# Patient Record
Sex: Male | Born: 1960 | Race: White | Hispanic: No | Marital: Single
Health system: Northeastern US, Academic
[De-identification: ages and names within clinical notes are randomized; demographics above are authoritative.]

## PROBLEM LIST (undated history)

## (undated) DIAGNOSIS — E785 Hyperlipidemia, unspecified: Secondary | ICD-10-CM

## (undated) HISTORY — DX: Hyperlipidemia, unspecified: E78.5

---

## 2000-03-13 HISTORY — PX: HERNIA REPAIR: SHX51

## 2013-05-01 ENCOUNTER — Ambulatory Visit (INDEPENDENT_AMBULATORY_CARE_PROVIDER_SITE_OTHER): Payer: BC Managed Care – PPO | Admitting: Internal Medicine

## 2013-05-01 ENCOUNTER — Encounter: Payer: Self-pay | Admitting: Internal Medicine

## 2013-05-01 VITALS — BP 124/78 | HR 93 | Temp 98.7°F | Ht 68.75 in | Wt 200.5 lb

## 2013-05-01 DIAGNOSIS — L821 Other seborrheic keratosis: Secondary | ICD-10-CM | POA: Insufficient documentation

## 2013-05-01 DIAGNOSIS — Z23 Encounter for immunization: Secondary | ICD-10-CM

## 2013-05-01 DIAGNOSIS — Z0000 Encounter for general adult medical examination without abnormal findings: Secondary | ICD-10-CM

## 2013-05-01 NOTE — Assessment & Plan Note (Signed)
If it bother you, we can biopsy the one on the right lower back  Call and let me know when you want this done

## 2013-05-01 NOTE — Progress Notes (Signed)
HPI  Pt presents to the clinic today to establish care. Richard [name] does not have a PCP. Richard [name] does have some concerns about a mole on his back. It has not changed in the last 10 years. Richard [name] did just want to have it looked at.  Flu: never Tetanus: more than 10 years ago PSA screening: never Colon Screening: never Eye Doctor: yearly Dentist:  As needed  History reviewed. No pertinent past medical history.  Current Outpatient Prescriptions  Medication Sig Dispense Refill  . Multiple Vitamin (MULTIVITAMIN) tablet Take 1 tablet by mouth daily.       No current facility-administered medications for this visit.    No Known Allergies  Family History  Problem Relation Age of Onset  . Cancer Mother     liver  . Cancer Maternal Uncle     bone  . Diabetes Paternal Grandmother   . Early death Neg Hx   . Hypertension Neg Hx   . Stroke Neg Hx     History   Social History  . Marital Status: Married    Spouse Name: N/A    Number of Children: N/A  . Years of Education: N/A   Occupational History  . Not on file.   Social History Main Topics  . Smoking status: Current Every Day Smoker -- 1.00 packs/day for 35 years    Types: Cigarettes  . Smokeless tobacco: Never Used  . Alcohol Use: No  . Drug Use: No  . Sexual Activity: Not on file   Other Topics Concern  . Not on file   Social History Narrative  . No narrative on file    ROS:  Constitutional: Denies fever, malaise, fatigue, headache or abrupt weight changes.  HEENT: Denies eye pain, eye redness, ear pain, ringing in the ears, wax buildup, runny nose, nasal congestion, bloody nose, or sore throat. Respiratory: Denies difficulty breathing, shortness of breath, cough or sputum production.   Cardiovascular: Denies chest pain, chest tightness, palpitations or swelling in the hands or feet.  Gastrointestinal: Denies abdominal pain, bloating, constipation, diarrhea or blood in the stool.  GU: Denies frequency, urgency, pain with  urination, blood in urine, odor or discharge. Musculoskeletal: Pt reports bursitis in bilateral shoulders. Denies decrease in range of motion, difficulty with gait, muscle pain or joint pain and swelling.  Skin: Pt reports moles on back. Denies redness, rashes, lesions or ulcercations.  Neurological: Denies dizziness, difficulty with memory, difficulty with speech or problems with balance and coordination.   No other specific complaints in a complete review of systems (except as listed in HPI above).  PE:  BP 124/78  Pulse 93  Temp(Src) 98.7 F (37.1 C) (Oral)  Ht 5' 8.75" (1.746 m)  Wt 200 lb 8 oz (90.946 kg)  BMI 29.83 kg/m2  SpO2 97% Wt Readings from Last 3 Encounters:  05/01/13 200 lb 8 oz (90.946 kg)    General: Appears his stated age, well developed, well nourished in NAD. Skin: Large 1 cm oval shaped mole note in right lower back. Skin otherwise clean, dry and intact. HEENT: Head: normal shape and size; Eyes: sclera white, no icterus, conjunctiva pink, PERRLA and EOMs intact; Ears: Tm's gray and intact, normal light reflex; Nose: mucosa pink and moist, septum midline; Throat/Mouth: Teeth present, mucosa pink and moist, no lesions or ulcerations noted.  Neck: Normal range of motion. Neck supple, trachea midline. No massses, lumps or thyromegaly present.  Cardiovascular: Normal rate and rhythm. S1,S2 noted.  No murmur, rubs or gallops noted.  No JVD or BLE edema. No carotid bruits noted. Pulmonary/Chest: Normal effort and positive vesicular breath sounds. No respiratory distress. No wheezes, rales or ronchi noted.  Abdomen: Soft and nontender. Normal bowel sounds, no bruits noted. No distention or masses noted. Liver, spleen and kidneys non palpable. Musculoskeletal: Normal range of motion. No signs of joint swelling. No difficulty with gait.  Neurological: Alert and oriented. Cranial nerves II-XII intact. Coordination normal. +DTRs bilaterally. Psychiatric: Mood and affect normal.  Behavior is normal. Judgment and thought content normal.    Assessment and Plan:  Preventative Health Maintenance:  Tdap given today Will obtain screening labs including PSA Stop smoking Advised him to work on diet and exercise Richard [name] declines colon screening at this time  RTC in 1 year or sooner if needed

## 2013-05-01 NOTE — Patient Instructions (Addendum)

## 2013-05-01 NOTE — Progress Notes (Signed)
Pre visit review using our clinic review tool, if applicable. No additional management support is needed unless otherwise documented below in the visit note. 

## 2013-05-01 NOTE — Addendum Note (Signed)
Addended by: Roena MaladyEVONTENNO, Chelisa Hennen Y on: 05/01/2013 04:42 PM   Modules accepted: Orders

## 2013-05-02 LAB — COMPREHENSIVE METABOLIC PANEL
ALBUMIN: 4.2 g/dL (ref 3.5–5.2)
ALK PHOS: 53 U/L (ref 39–117)
ALT: 23 U/L (ref 0–53)
AST: 18 U/L (ref 0–37)
BUN: 14 mg/dL (ref 6–23)
CO2: 30 mEq/L (ref 19–32)
CREATININE: 1 mg/dL (ref 0.4–1.5)
Calcium: 9.5 mg/dL (ref 8.4–10.5)
Chloride: 106 mEq/L (ref 96–112)
GFR: 82.01 mL/min (ref >60.00)
Glucose, Bld: 94 mg/dL (ref 70–99)
Potassium: 4.7 mEq/L (ref 3.5–5.1)
Sodium: 140 mEq/L (ref 135–145)
Total Bilirubin: 0.3 mg/dL (ref 0.3–1.2)
Total Protein: 7.4 g/dL (ref 6.0–8.3)

## 2013-05-02 LAB — CBC
HCT: 50.2 % (ref 39.0–52.0)
HEMOGLOBIN: 16.5 g/dL (ref 13.0–17.0)
MCHC: 32.9 g/dL (ref 30.0–36.0)
MCV: 89.4 fl (ref 78.0–100.0)
PLATELETS: 237 10*3/uL (ref 150.0–400.0)
RBC: 5.61 Mil/uL (ref 4.22–5.81)
RDW: 14.5 % (ref 11.5–14.6)
WBC: 13 10*3/uL — ABNORMAL HIGH (ref 4.5–10.5)

## 2013-05-02 LAB — PSA: PSA: 1.74 ng/mL (ref 0.10–4.00)

## 2013-05-02 LAB — LIPID PANEL
CHOL/HDL RATIO: 6
Cholesterol: 256 mg/dL — ABNORMAL HIGH (ref 0–200)
HDL: 40.2 mg/dL (ref >39.00)
TRIGLYCERIDES: 217 mg/dL — AB (ref 0.0–149.0)
VLDL: 43.4 mg/dL — ABNORMAL HIGH (ref 0.0–40.0)

## 2013-05-02 LAB — LDL CHOLESTEROL, DIRECT: Direct LDL: 188.6 mg/dL

## 2013-05-15 ENCOUNTER — Ambulatory Visit: Payer: BC Managed Care – PPO | Admitting: Internal Medicine

## 2013-07-31 ENCOUNTER — Ambulatory Visit (INDEPENDENT_AMBULATORY_CARE_PROVIDER_SITE_OTHER): Payer: BC Managed Care – PPO | Admitting: Internal Medicine

## 2013-07-31 ENCOUNTER — Encounter: Payer: Self-pay | Admitting: Internal Medicine

## 2013-07-31 VITALS — BP 104/66 | HR 77 | Temp 98.0°F | Wt 200.2 lb

## 2013-07-31 DIAGNOSIS — E785 Hyperlipidemia, unspecified: Secondary | ICD-10-CM | POA: Insufficient documentation

## 2013-07-31 NOTE — Progress Notes (Signed)
Subjective:    Patient ID: Richard [name], male    DOB: [birth year], 53 y.o.   MRN: [id number]  HPI  Pt presents to the clinic today to followup elevated cholesterol and triglycerides. In 04/2013, his total cholesterol was 256, LDL 188.6, HDL 40.2 and triglycerides of 217. He was started on a low cholesterol, low fat diet and encouraged to exercise. He has been doing well with the diet plan given. He reports he has not started the fish oil that was instructed.  Review of Systems      No past medical history on file.  Current Outpatient Prescriptions  Medication Sig Dispense Refill  . Multiple Vitamin (MULTIVITAMIN) tablet Take 1 tablet by mouth daily.       No current facility-administered medications for this visit.    No Known Allergies  Family History  Problem Relation Age of Onset  . Cancer Mother     liver  . Cancer Maternal Uncle     bone  . Diabetes Paternal Grandmother   . Early death Neg Hx   . Hypertension Neg Hx   . Stroke Neg Hx     History   Social History  . Marital Status: Married    Spouse Name: N/A    Number of Children: N/A  . Years of Education: N/A   Occupational History  . Not on file.   Social History Main Topics  . Smoking status: Current Every Day Smoker -- 1.00 packs/day for 35 years    Types: Cigarettes  . Smokeless tobacco: Never Used  . Alcohol Use: No  . Drug Use: No  . Sexual Activity: Not on file   Other Topics Concern  . Not on file   Social History Narrative  . No narrative on file     Constitutional: Denies fever, malaise, fatigue, headache or abrupt weight changes.  Respiratory: Denies difficulty breathing, shortness of breath, cough or sputum production.   Cardiovascular: Denies chest pain, chest tightness, palpitations or swelling in the hands or feet.    No other specific complaints in a complete review of systems (except as listed in HPI above).  Objective:   Physical Exam   BP 104/66  Pulse 77   Temp(Src) 98 F (36.7 C) (Oral)  Wt 200 lb 4 oz (90.833 kg)  SpO2 97% Wt Readings from Last 3 Encounters:  07/31/13 200 lb 4 oz (90.833 kg)  05/01/13 200 lb 8 oz (90.946 kg)    General: Appears his stated age, well developed, well nourished in NAD. Cardiovascular: Normal rate and rhythm. S1,S2 noted.  No murmur, rubs or gallops noted. No JVD or BLE edema. No carotid bruits noted. Pulmonary/Chest: Normal effort and positive vesicular breath sounds. No respiratory distress. No wheezes, rales or ronchi noted.   BMET    Component Value Date/Time   NA 140 05/01/2013 1602   K 4.7 05/01/2013 1602   CL 106 05/01/2013 1602   CO2 30 05/01/2013 1602   GLUCOSE 94 05/01/2013 1602   BUN 14 05/01/2013 1602   CREATININE 1.0 05/01/2013 1602   CALCIUM 9.5 05/01/2013 1602    Lipid Panel     Component Value Date/Time   CHOL 256* 05/01/2013 1602   TRIG 217.0* 05/01/2013 1602   HDL 40.20 05/01/2013 1602   CHOLHDL 6 05/01/2013 1602   VLDL 43.4* 05/01/2013 1602    CBC    Component Value Date/Time   WBC 13.0* 05/01/2013 1602   RBC 5.61 05/01/2013 1602   HGB  16.5 05/01/2013 1602   HCT 50.2 05/01/2013 1602   PLT 237.0 05/01/2013 1602   MCV 89.4 05/01/2013 1602   MCHC 32.9 05/01/2013 1602   RDW 14.5 05/01/2013 1602    Hgb A1C No results found for this basename: HGBA1C        Assessment & Plan:

## 2013-07-31 NOTE — Assessment & Plan Note (Signed)
Will recheck lipid profile today If remains elevated, will start crestor Encouraged him to continue to work on diet and exercise

## 2013-07-31 NOTE — Progress Notes (Signed)
Pre visit review using our clinic review tool, if applicable. No additional management support is needed unless otherwise documented below in the visit note. 

## 2013-07-31 NOTE — Patient Instructions (Addendum)
Fat and Cholesterol Control Diet  Fat and cholesterol levels in your blood and organs are influenced by your diet. High levels of fat and cholesterol may lead to diseases of the heart, small and large blood vessels, gallbladder, liver, and pancreas.  CONTROLLING FAT AND CHOLESTEROL WITH DIET  Although exercise and lifestyle factors are important, your diet is key. That is because certain foods are known to raise cholesterol and others to lower it. The goal is to balance foods for their effect on cholesterol and more importantly, to replace saturated and trans fat with other types of fat, such as monounsaturated fat, polyunsaturated fat, and omega-3 fatty acids.  On average, a person should consume no more than 15 to 17 g of saturated fat daily. Saturated and trans fats are considered "bad" fats, and they will raise LDL cholesterol. Saturated fats are primarily found in animal products such as meats, butter, and cream. However, that does not mean you need to give up all your favorite foods. Today, there are good tasting, low-fat, low-cholesterol substitutes for most of the things you like to eat. Choose low-fat or nonfat alternatives. Choose round or loin cuts of red meat. These types of cuts are lowest in fat and cholesterol. Chicken (without the skin), fish, veal, and ground turkey breast are great choices. Eliminate fatty meats, such as hot dogs and salami. Even shellfish have little or no saturated fat. Have a 3 oz (85 g) portion when you eat lean meat, poultry, or fish.  Trans fats are also called "partially hydrogenated oils." They are oils that have been scientifically manipulated so that they are solid at room temperature resulting in a longer shelf life and improved taste and texture of foods in which they are added. Trans fats are found in stick margarine, some tub margarines, cookies, crackers, and baked goods.   When baking and cooking, oils are a great substitute for butter. The monounsaturated oils are  especially beneficial since it is believed they lower LDL and raise HDL. The oils you should avoid entirely are saturated tropical oils, such as coconut and palm.   Remember to eat a lot from food groups that are naturally free of saturated and trans fat, including fish, fruit, vegetables, beans, grains (barley, rice, couscous, bulgur wheat), and pasta (without cream sauces).   IDENTIFYING FOODS THAT LOWER FAT AND CHOLESTEROL   Soluble fiber may lower your cholesterol. This type of fiber is found in fruits such as apples, vegetables such as broccoli, potatoes, and carrots, legumes such as beans, peas, and lentils, and grains such as barley. Foods fortified with plant sterols (phytosterol) may also lower cholesterol. You should eat at least 2 g per day of these foods for a cholesterol lowering effect.   Read package labels to identify low-saturated fats, trans fat free, and low-fat foods at the supermarket. Select cheeses that have only 2 to 3 g saturated fat per ounce. Use a heart-healthy tub margarine that is free of trans fats or partially hydrogenated oil. When buying baked goods (cookies, crackers), avoid partially hydrogenated oils. Breads and muffins should be made from whole grains (whole-wheat or whole oat flour, instead of "flour" or "enriched flour"). Buy non-creamy canned soups with reduced salt and no added fats.   FOOD PREPARATION TECHNIQUES   Never deep-fry. If you must fry, either stir-fry, which uses very little fat, or use non-stick cooking sprays. When possible, broil, bake, or roast meats, and steam vegetables. Instead of putting butter or margarine on vegetables, use lemon   and herbs, applesauce, and cinnamon (for squash and sweet potatoes). Use nonfat yogurt, salsa, and low-fat dressings for salads.   LOW-SATURATED FAT / LOW-FAT FOOD SUBSTITUTES  Meats / Saturated Fat (g)  · Avoid: Steak, marbled (3 oz/85 g) / 11 g  · Choose: Steak, lean (3 oz/85 g) / 4 g  · Avoid: Hamburger (3 oz/85 g) / 7  g  · Choose: Hamburger, lean (3 oz/85 g) / 5 g  · Avoid: Ham (3 oz/85 g) / 6 g  · Choose: Ham, lean cut (3 oz/85 g) / 2.4 g  · Avoid: Chicken, with skin, dark meat (3 oz/85 g) / 4 g  · Choose: Chicken, skin removed, dark meat (3 oz/85 g) / 2 g  · Avoid: Chicken, with skin, light meat (3 oz/85 g) / 2.5 g  · Choose: Chicken, skin removed, light meat (3 oz/85 g) / 1 g  Dairy / Saturated Fat (g)  · Avoid: Whole milk (1 cup) / 5 g  · Choose: Low-fat milk, 2% (1 cup) / 3 g  · Choose: Low-fat milk, 1% (1 cup) / 1.5 g  · Choose: Skim milk (1 cup) / 0.3 g  · Avoid: Hard cheese (1 oz/28 g) / 6 g  · Choose: Skim milk cheese (1 oz/28 g) / 2 to 3 g  · Avoid: Cottage cheese, 4% fat (1 cup) / 6.5 g  · Choose: Low-fat cottage cheese, 1% fat (1 cup) / 1.5 g  · Avoid: Ice cream (1 cup) / 9 g  · Choose: Sherbet (1 cup) / 2.5 g  · Choose: Nonfat frozen yogurt (1 cup) / 0.3 g  · Choose: Frozen fruit bar / trace  · Avoid: Whipped cream (1 tbs) / 3.5 g  · Choose: Nondairy whipped topping (1 tbs) / 1 g  Condiments / Saturated Fat (g)  · Avoid: Mayonnaise (1 tbs) / 2 g  · Choose: Low-fat mayonnaise (1 tbs) / 1 g  · Avoid: Butter (1 tbs) / 7 g  · Choose: Extra light margarine (1 tbs) / 1 g  · Avoid: Coconut oil (1 tbs) / 11.8 g  · Choose: Olive oil (1 tbs) / 1.8 g  · Choose: Corn oil (1 tbs) / 1.7 g  · Choose: Safflower oil (1 tbs) / 1.2 g  · Choose: Sunflower oil (1 tbs) / 1.4 g  · Choose: Soybean oil (1 tbs) / 2.4 g  · Choose: Canola oil (1 tbs) / 1 g  Document Released: 02/27/2005 Document Revised: 06/24/2012 Document Reviewed: 08/18/2010  [employer]® Patient Information ©2014 [employer].

## 2013-08-01 LAB — LIPID PANEL
CHOL/HDL RATIO: 6
Cholesterol: 197 mg/dL (ref 0–200)
HDL: 32.7 mg/dL — ABNORMAL LOW (ref >39.00)
LDL Cholesterol: 144 mg/dL — ABNORMAL HIGH (ref 0–99)
Triglycerides: 100 mg/dL (ref 0.0–149.0)
VLDL: 20 mg/dL (ref 0.0–40.0)

## 2014-01-07 ENCOUNTER — Encounter: Payer: Self-pay | Admitting: Internal Medicine

## 2014-01-07 ENCOUNTER — Encounter: Payer: BC Managed Care – PPO | Admitting: Internal Medicine

## 2014-01-07 NOTE — Progress Notes (Signed)
error 

## 2014-01-08 ENCOUNTER — Telehealth: Payer: Self-pay | Admitting: Internal Medicine

## 2014-01-08 NOTE — Telephone Encounter (Signed)
emmi emailed °

## 2014-05-06 ENCOUNTER — Ambulatory Visit (INDEPENDENT_AMBULATORY_CARE_PROVIDER_SITE_OTHER): Payer: BLUE CROSS/BLUE SHIELD | Admitting: Internal Medicine

## 2014-05-06 ENCOUNTER — Encounter: Payer: Self-pay | Admitting: Internal Medicine

## 2014-05-06 VITALS — BP 116/68 | HR 69 | Temp 97.9°F | Ht 69.0 in | Wt 199.5 lb

## 2014-05-06 DIAGNOSIS — Z125 Encounter for screening for malignant neoplasm of prostate: Secondary | ICD-10-CM

## 2014-05-06 DIAGNOSIS — Z0000 Encounter for general adult medical examination without abnormal findings: Secondary | ICD-10-CM

## 2014-05-06 LAB — CBC
HCT: 44.9 % (ref 39.0–52.0)
Hemoglobin: 15.4 g/dL (ref 13.0–17.0)
MCHC: 34.2 g/dL (ref 30.0–36.0)
MCV: 86.2 fl (ref 78.0–100.0)
Platelets: 213 10*3/uL (ref 150.0–400.0)
RBC: 5.21 Mil/uL (ref 4.22–5.81)
RDW: 14 % (ref 11.5–15.5)
WBC: 10.9 10*3/uL — AB (ref 4.0–10.5)

## 2014-05-06 LAB — COMPREHENSIVE METABOLIC PANEL
ALK PHOS: 53 U/L (ref 39–117)
ALT: 13 U/L (ref 0–53)
AST: 13 U/L (ref 0–37)
Albumin: 4 g/dL (ref 3.5–5.2)
BILIRUBIN TOTAL: 0.6 mg/dL (ref 0.2–1.2)
BUN: 14 mg/dL (ref 6–23)
CO2: 27 mEq/L (ref 19–32)
Calcium: 8.7 mg/dL (ref 8.4–10.5)
Chloride: 109 mEq/L (ref 96–112)
Creatinine, Ser: 0.94 mg/dL (ref 0.40–1.50)
GFR: 88.75 mL/min (ref >60.00)
GLUCOSE: 95 mg/dL (ref 70–99)
POTASSIUM: 3.9 meq/L (ref 3.5–5.1)
Sodium: 140 mEq/L (ref 135–145)
Total Protein: 6.5 g/dL (ref 6.0–8.3)

## 2014-05-06 LAB — LIPID PANEL
CHOL/HDL RATIO: 6
Cholesterol: 214 mg/dL — ABNORMAL HIGH (ref 0–200)
HDL: 35 mg/dL — ABNORMAL LOW (ref >39.00)
LDL Cholesterol: 147 mg/dL — ABNORMAL HIGH (ref 0–99)
NONHDL: 179
Triglycerides: 161 mg/dL — ABNORMAL HIGH (ref 0.0–149.0)
VLDL: 32.2 mg/dL (ref 0.0–40.0)

## 2014-05-06 LAB — PSA: PSA: 1.07 ng/mL (ref 0.10–4.00)

## 2014-05-06 NOTE — Progress Notes (Signed)
Pre visit review using our clinic review tool, if applicable. No additional management support is needed unless otherwise documented below in the visit note. 

## 2014-05-06 NOTE — Progress Notes (Signed)
Subjective:    Patient ID: Richard [name], male    DOB: 05/11/1960, 54 y.o.   MRN: [id number]  HPI  Pt presents to the clinic today for his annual exam.  Flu: never Tetanus: 2015 PSA Screening: 04/2013 Colon Screening: never Vision Screening: within the last year Dentist: as needed  Diet: He does not adhere to any specific diet Exercise: none  Review of Systems      No past medical history on file.  Current Outpatient Prescriptions  Medication Sig Dispense Refill  . Multiple Vitamin (MULTIVITAMIN) tablet Take 1 tablet by mouth daily.     No current facility-administered medications for this visit.    No Known Allergies  Family History  Problem Relation Age of Onset  . Cancer Mother     liver  . Cancer Maternal Uncle     bone  . Diabetes Paternal Grandmother   . Early death Neg Hx   . Hypertension Neg Hx   . Stroke Neg Hx     History   Social History  . Marital Status: Married    Spouse Name: N/A  . Number of Children: N/A  . Years of Education: N/A   Occupational History  . Not on file.   Social History Main Topics  . Smoking status: Current Every Day Smoker -- 1.00 packs/day for 35 years    Types: Cigarettes  . Smokeless tobacco: Never Used  . Alcohol Use: No  . Drug Use: No  . Sexual Activity: Not on file   Other Topics Concern  . Not on file   Social History Narrative     Constitutional: Denies fever, malaise, fatigue, headache or abrupt weight changes.  HEENT: Denies eye pain, eye redness, ear pain, ringing in the ears, wax buildup, runny nose, nasal congestion, bloody nose, or sore throat. Respiratory: Denies difficulty breathing, shortness of breath, cough or sputum production.   Cardiovascular: Denies chest pain, chest tightness, palpitations or swelling in the hands or feet.  Gastrointestinal: Denies abdominal pain, bloating, constipation, diarrhea or blood in the stool.  GU: Denies urgency, frequency, pain with urination, burning  sensation, blood in urine, odor or discharge. Musculoskeletal: Denies decrease in range of motion, difficulty with gait, muscle pain or joint pain and swelling.  Skin: Denies redness, rashes, lesions or ulcercations.  Neurological: Denies dizziness, difficulty with memory, difficulty with speech or problems with balance and coordination.  Psych: Pt denies depression, anxiety, SI/HI.  No other specific complaints in a complete review of systems (except as listed in HPI above).  Objective:   Physical Exam   BP 116/68 mmHg  Pulse 69  Temp(Src) 97.9 F (36.6 C) (Oral)  Ht 5\' 9"  (1.753 m)  Wt 199 lb 8 oz (90.493 kg)  BMI 29.45 kg/m2  SpO2 97% Wt Readings from Last 3 Encounters:  05/06/14 199 lb 8 oz (90.493 kg)  07/31/13 200 lb 4 oz (90.833 kg)  05/01/13 200 lb 8 oz (90.946 kg)    General: Appears hisstated age, well developed, well nourished in NAD. Skin: Warm, dry and intact. No rashes, lesions or ulcerations noted. HEENT: Head: normal shape and size; Eyes: sclera white, no icterus, conjunctiva pink, PERRLA and EOMs intact; Ears: Tm's gray and intact, normal light reflex; Nose: mucosa pink and moist, septum midline; Throat/Mouth: Teeth present, mucosa pink and moist, no exudate, lesions or ulcerations noted.  Neck: Neck supple, trachea midline. No masses, lumps or thyromegaly present.  Cardiovascular: Normal rate and rhythm. S1,S2 noted.  No  murmur, rubs or gallops noted. No JVD or BLE edema. No carotid bruits noted. Pulmonary/Chest: Normal effort and positive vesicular breath sounds. No respiratory distress. No wheezes, rales or ronchi noted.  Abdomen: Soft and nontender. Normal bowel sounds, no bruits noted. No distention or masses noted. Liver, spleen and kidneys non palpable. Musculoskeletal: Normal range of motion. No signs of joint swelling. No difficulty with gait.  Neurological: Alert and oriented. Cranial nerves [name]-XII grossly intact. Coordination normal.  Psychiatric: Mood  and affect normal. Behavior is normal. Judgment and thought content normal.     BMET    Component Value Date/Time   NA 140 05/01/2013 1602   K 4.7 05/01/2013 1602   CL 106 05/01/2013 1602   CO2 30 05/01/2013 1602   GLUCOSE 94 05/01/2013 1602   BUN 14 05/01/2013 1602   CREATININE 1.0 05/01/2013 1602   CALCIUM 9.5 05/01/2013 1602    Lipid Panel     Component Value Date/Time   CHOL 197 07/31/2013 1637   TRIG 100.0 07/31/2013 1637   HDL 32.70* 07/31/2013 1637   CHOLHDL 6 07/31/2013 1637   VLDL 20.0 07/31/2013 1637   LDLCALC 144* 07/31/2013 1637    CBC    Component Value Date/Time   WBC 13.0* 05/01/2013 1602   RBC 5.61 05/01/2013 1602   HGB 16.5 05/01/2013 1602   HCT 50.2 05/01/2013 1602   PLT 237.0 05/01/2013 1602   MCV 89.4 05/01/2013 1602   MCHC 32.9 05/01/2013 1602   RDW 14.5 05/01/2013 1602    Hgb A1C No results found for: HGBA1C      Assessment & Plan:   Preventative Health Maintenance:  He declines flu shot today Discussed Low radiation CT scan to evaluate for lung cancer (he declines at this time) Advised him to cut back smoking Encouraged him to work on diet and exercise Will check CBC, CMET, Lipid profile and PSA  RTC in 1 year or sooner if needed

## 2014-05-06 NOTE — Patient Instructions (Signed)

## 2014-05-23 ENCOUNTER — Emergency Department (HOSPITAL_COMMUNITY): Payer: BLUE CROSS/BLUE SHIELD

## 2014-05-23 ENCOUNTER — Encounter (HOSPITAL_COMMUNITY): Payer: Self-pay | Admitting: *Deleted

## 2014-05-23 ENCOUNTER — Emergency Department (HOSPITAL_COMMUNITY)
Admission: EM | Admit: 2014-05-23 | Discharge: 2014-05-23 | Disposition: A | Discharge status: Home / Self Care | Payer: BLUE CROSS/BLUE SHIELD | Attending: Emergency Medicine | Admitting: Emergency Medicine

## 2014-05-23 DIAGNOSIS — Z720 Tobacco use: Secondary | ICD-10-CM | POA: Insufficient documentation

## 2014-05-23 DIAGNOSIS — N200 Calculus of kidney: Secondary | ICD-10-CM | POA: Diagnosis not present

## 2014-05-23 DIAGNOSIS — Z79899 Other long term (current) drug therapy: Secondary | ICD-10-CM | POA: Diagnosis not present

## 2014-05-23 DIAGNOSIS — N390 Urinary tract infection, site not specified: Secondary | ICD-10-CM | POA: Diagnosis not present

## 2014-05-23 DIAGNOSIS — N281 Cyst of kidney, acquired: Secondary | ICD-10-CM | POA: Diagnosis not present

## 2014-05-23 DIAGNOSIS — R1032 Left lower quadrant pain: Secondary | ICD-10-CM

## 2014-05-23 LAB — COMPREHENSIVE METABOLIC PANEL
ALBUMIN: 3.7 g/dL (ref 3.5–5.2)
ALT: 15 U/L (ref 0–53)
AST: 15 U/L (ref 0–37)
Alkaline Phosphatase: 46 U/L (ref 39–117)
Anion gap: 7 (ref 5–15)
BUN: 20 mg/dL (ref 6–23)
CHLORIDE: 104 mmol/L (ref 96–112)
CO2: 26 mmol/L (ref 19–32)
Calcium: 8.3 mg/dL — ABNORMAL LOW (ref 8.4–10.5)
Creatinine, Ser: 1.36 mg/dL — ABNORMAL HIGH (ref 0.50–1.35)
GFR calc non Af Amer: 58 mL/min — ABNORMAL LOW (ref >90)
GFR, EST AFRICAN AMERICAN: 67 mL/min — AB (ref >90)
Glucose, Bld: 90 mg/dL (ref 70–99)
Potassium: 3.7 mmol/L (ref 3.5–5.1)
SODIUM: 137 mmol/L (ref 135–145)
TOTAL PROTEIN: 6.8 g/dL (ref 6.0–8.3)
Total Bilirubin: 1 mg/dL (ref 0.3–1.2)

## 2014-05-23 LAB — URINALYSIS, ROUTINE W REFLEX MICROSCOPIC
Bilirubin Urine: NEGATIVE
Glucose, UA: NEGATIVE mg/dL
KETONES UR: NEGATIVE mg/dL
Leukocytes, UA: NEGATIVE
NITRITE: NEGATIVE
Specific Gravity, Urine: 1.025 (ref 1.005–1.030)
Urobilinogen, UA: 0.2 mg/dL (ref 0.0–1.0)
pH: 6 (ref 5.0–8.0)

## 2014-05-23 LAB — CBC WITH DIFFERENTIAL/PLATELET
Basophils Absolute: 0 10*3/uL (ref 0.0–0.1)
Basophils Relative: 0 % (ref 0–1)
Eosinophils Absolute: 0.2 10*3/uL (ref 0.0–0.7)
Eosinophils Relative: 1 % (ref 0–5)
HEMATOCRIT: 45.7 % (ref 39.0–52.0)
Hemoglobin: 16 g/dL (ref 13.0–17.0)
LYMPHS PCT: 18 % (ref 12–46)
Lymphs Abs: 2.8 10*3/uL (ref 0.7–4.0)
MCH: 30.1 pg (ref 26.0–34.0)
MCHC: 35 g/dL (ref 30.0–36.0)
MCV: 86.1 fL (ref 78.0–100.0)
MONO ABS: 1.1 10*3/uL — AB (ref 0.1–1.0)
MONOS PCT: 7 % (ref 3–12)
Neutro Abs: 11 10*3/uL — ABNORMAL HIGH (ref 1.7–7.7)
Neutrophils Relative %: 73 % (ref 43–77)
Platelets: 192 10*3/uL (ref 150–400)
RBC: 5.31 MIL/uL (ref 4.22–5.81)
RDW: 13.7 % (ref 11.5–15.5)
WBC: 15 10*3/uL — AB (ref 4.0–10.5)

## 2014-05-23 LAB — URINE MICROSCOPIC-ADD ON

## 2014-05-23 MED ORDER — PROMETHAZINE HCL 25 MG PO TABS: 25.0000 mg | ORAL_TABLET | Freq: Four times a day (QID) | ORAL | Status: DC | PRN

## 2014-05-23 MED ORDER — OXYCODONE-ACETAMINOPHEN 5-325 MG PO TABS: 1.0000 | ORAL_TABLET | ORAL | Status: DC | PRN

## 2014-05-23 MED ORDER — IOHEXOL 300 MG/ML  SOLN
100.0000 mL | Freq: Once | INTRAMUSCULAR | Status: AC | PRN
  Administered 2014-05-23: 100 mL via INTRAVENOUS

## 2014-05-23 MED ORDER — SODIUM CHLORIDE 0.9 % IV BOLUS (SEPSIS)
1000.0000 mL | Freq: Once | INTRAVENOUS | Status: AC
  Administered 2014-05-23: 1000 mL via INTRAVENOUS

## 2014-05-23 MED ORDER — CIPROFLOXACIN HCL 500 MG PO TABS: 500.0000 mg | ORAL_TABLET | Freq: Two times a day (BID) | ORAL | Status: DC

## 2014-05-23 NOTE — Discharge Instructions (Signed)
You have a left sided kidney stone, left kidney cyst, right kidney nodule.   Meds for pain, nausea, and antibiotic.  See the urologist.  Phone number given.

## 2014-05-23 NOTE — ED Provider Notes (Signed)
CSN: [id number]     Arrival date & time 05/23/14  1154 History  This chart was scribed for Donnetta Hutching, MD by Roxy Cedar, ED Scribe. This patient was seen in room APA11/APA11 and the patient's care was started at 12:48 PM.   Chief Complaint  Patient presents with  . Abdominal Pain   Patient is a 54 y.o. male presenting with abdominal pain. The history is provided by the patient. No language interpreter was used.  Abdominal Pain  HPI Comments: OSUALDO HANSELL II is a 54 y.o. male with a PMHx of bilateral inguinal hernia repair x2, who presents to the Emergency Department complaining of moderate left sided uprapubic pain that began 5 days ago. Patient reports abnormal bowel movements and constipation for the past 4 days. He reports associated nausea and vomiting, which is intermittently exacerbated by intake of food. He denies injury to area. He denies exacerbation of pain with ambulation or movement.   History reviewed. No pertinent past medical history. Past Surgical History  Procedure Laterality Date  . Hernia repair  2002    Double   Family History  Problem Relation Age of Onset  . Cancer Mother     liver  . Cancer Maternal Uncle     bone  . Diabetes Paternal Grandmother   . Early death Neg Hx   . Hypertension Neg Hx   . Stroke Neg Hx    History  Substance Use Topics  . Smoking status: Current Every Day Smoker -- 1.00 packs/day for 35 years    Types: Cigarettes  . Smokeless tobacco: Never Used  . Alcohol Use: No   Review of Systems  Gastrointestinal: Positive for abdominal pain.   A complete 10 system review of systems was obtained and all systems are negative except as noted in the HPI and PMH.   Allergies  Review of patient's allergies indicates no known allergies.  Home Medications   Prior to Admission medications   Medication Sig Start Date End Date Taking? Authorizing Provider  bismuth subsalicylate (PEPTO BISMOL) 262 MG/15ML suspension Take 30 mLs by mouth  every 6 (six) hours as needed (abdominal pain).    Yes Historical Provider, MD  Multiple Vitamin (MULTIVITAMIN) tablet Take 1 tablet by mouth daily.   Yes Historical Provider, MD  omega-3 fish oil (MAXEPA) 1000 MG CAPS capsule Take 1 capsule by mouth daily.   Yes Historical Provider, MD  ciprofloxacin (CIPRO) 500 MG tablet Take 1 tablet (500 mg total) by mouth 2 (two) times daily. 05/23/14   Donnetta Hutching, MD  oxyCODONE-acetaminophen (PERCOCET) 5-325 MG per tablet Take 1-2 tablets by mouth every 4 (four) hours as needed. 05/23/14   Donnetta Hutching, MD  promethazine (PHENERGAN) 25 MG tablet Take 1 tablet (25 mg total) by mouth every 6 (six) hours as needed. 05/23/14   Donnetta Hutching, MD   Triage Vitals: BP 104/84 mmHg  Pulse 78  Temp(Src) 99 F (37.2 C) (Oral)  Resp 18  Ht  (1.753 m)  Wt 200 lb (90.719 kg)  BMI 29.52 kg/m2  SpO2 96%  Physical Exam  Constitutional: He is oriented to person, place, and time. He appears well-developed and well-nourished.  HENT:  Head: Normocephalic and atraumatic.  Eyes: Conjunctivae and EOM are normal. Pupils are equal, round, and reactive to light.  Neck: Normal range of motion. Neck supple.  Cardiovascular: Normal rate and regular rhythm.   Pulmonary/Chest: Effort normal and breath sounds normal.  Abdominal: Soft. Bowel sounds are normal. There is tenderness.  Tenderness to palpation of left suprapubic area and left lower quadrant  Musculoskeletal: Normal range of motion.  Neurological: He is alert and oriented to person, place, and time.  Skin: Skin is warm and dry.  Psychiatric: He has a normal mood and affect. His behavior is normal.  Nursing note and vitals reviewed.  ED Course  Procedures (including critical care time) DIAGNOSTIC STUDIES: Oxygen Saturation is 96% on RA, normal by my interpretation.    COORDINATION OF CARE: 12:53 PM- Discussed plans to order diagnostic CT imaging, lab work and urinalysis. Pt advised of plan for treatment and pt  agrees.  Labs Review Labs Reviewed  COMPREHENSIVE METABOLIC PANEL - Abnormal; Notable for the following:    Creatinine, Ser 1.36 (*)    Calcium 8.3 (*)    GFR calc non Af Amer 58 (*)    GFR calc Af Amer 67 (*)    All other components within normal limits  CBC WITH DIFFERENTIAL/PLATELET - Abnormal; Notable for the following:    WBC 15.0 (*)    Neutro Abs 11.0 (*)    Monocytes Absolute 1.1 (*)    All other components within normal limits  URINALYSIS, ROUTINE W REFLEX MICROSCOPIC - Abnormal; Notable for the following:    APPearance HAZY (*)    Hgb urine dipstick LARGE (*)    Protein, ur TRACE (*)    All other components within normal limits  URINE MICROSCOPIC-ADD ON - Abnormal; Notable for the following:    Bacteria, UA FEW (*)    Casts GRANULAR CAST (*)    All other components within normal limits  URINE CULTURE    Imaging Review Ct Abdomen Pelvis W Contrast  05/23/2014   CLINICAL DATA:  LLQ pain x 4 days . Last normal BM 5 days ago. Decreased appetite, nausea, vomiting. Pt has surg hx of bilateral hernia repair x 2. Last surgery approximately 10 years ago./bbj  EXAM: CT ABDOMEN AND PELVIS WITH CONTRAST  TECHNIQUE: Multidetector CT imaging of the abdomen and pelvis was performed using the standard protocol following bolus administration of intravenous contrast.  CONTRAST:  OMNIPAQUE IOHEXOL 300 MG/ML  SOLN  COMPARISON:  None.  FINDINGS: Lung bases demonstrates subtle posterior bibasilar scarring/ atelectasis. There is a 4.4 cm focus of herniated mesenteric fat through a diaphragmatic rent over the medial right lower thorax.  Abdominal images demonstrate a normal liver, spleen, pancreas, gallbladder and adrenal glands. Appendix is normal. Vascular structures are unremarkable. There is mild diverticulosis of the sigmoid colon.  Kidneys are normal in size. There is a 1-2 mm stone over the lower pole of the left kidney. There is mild left-sided hydronephrosis with mild dilatation of the  left ureter as there is a 3.5 mm stone at the left UVJ just inside the bladder. Subtle stranding of the left perinephric fat and subtle decreased opacification of the left renal cortex. No right renal or ureteral stones. There is a 1 cm cyst over the lower pole of the left kidney. Multiple small sub cm bilateral renal cortical hypodensities too small to characterize but likely cysts. There is a homogeneous exophytic 1 cm nodule off the upper pole of the right kidney with Hounsfield unit measurements of 74 likely a hyperdense cyst although indeterminate.  Remaining pelvic structures are notable for evidence of previous right inguinal hernia repair. The rectum and prostate are unremarkable.  There is mild age in change of the spine. There is a mild compression deformity of L3 likely chronic.  IMPRESSION: Left-sided nephrolithiasis.  3.5 mm stone at the left UVJ just inside the bladder causing low-grade obstruction.  1 cm cyst over the lower pole left kidney with multiple subcentimeter renal cortical hypodensities likely cysts but too small to characterize. 1 cm exophytic homogeneous nodule over the upper pole right kidney which is indeterminate although likely a hyperdense cyst. Recommend followup CT pre and post contrast vs MRI for further evaluation.  Mild L3 compression fracture likely chronic.  Mild diverticulosis of the sigmoid colon.   Electronically Signed   By: Daniel  Boyle M.D.   On: 03/1Elberta Fortis2/2016 15:21     EKG Interpretation None     MDM   Final diagnoses:  LLQ pain  Kidney stone on left side    No acute abdomen. Described results of CT scan with patient and his wife including left UVJ stone, left renal cyst, right indeterminate renal nodule. Patient will follow-up with urology. Discharge medications Percocet, Phenergan 25 mg, Cipro 500 mg  I personally performed the services described in this documentation, which was scribed in my presence. The recorded information has been reviewed and is  accurate.   Donnetta HutchingBrian Jaylen Knope, MD 05/24/14 [phone]

## 2014-05-23 NOTE — ED Notes (Signed)
Pt with left abd pain since Tuesday, ? LBM on Monday, denies diarrhea or N/V

## 2014-05-24 LAB — URINE CULTURE
COLONY COUNT: NO GROWTH
Culture: NO GROWTH
Special Requests: NORMAL

## 2014-05-25 ENCOUNTER — Telehealth: Payer: Self-pay

## 2014-05-25 NOTE — Telephone Encounter (Signed)
PLEASE NOTE: All timestamps contained within this report are represented as [place] Standard Time. CONFIDENTIALTY NOTICE: This fax transmission is intended only for the addressee. It contains information that is legally privileged, confidential or otherwise protected from use or disclosure. If you are not the intended recipient, you are strictly prohibited from reviewing, disclosing, copying using or disseminating any of this information or taking any action in reliance on or regarding this information. If you have received this fax in error, please notify us immediately by telephone so that we can arrange for its return to us. Phone: [phone], Toll-Free: [phone], Fax: [phone] Page: 1 of 2 Call Id: [id number] [facility] - Client TELEPHONE ADVICE RECORD [facility] Patient Name: Richard GradJOHN [name] Gender: Male DOB: 09/25/1960 Age: [age] Y 5 M 12 D Return Phone Number: [phone] (Primary) Address: [address] [place]Zip: [place] [id number] Client [facility] Client Client Site [facility] - Night Physician Nicki ReaperBaity, Regina Contact Type Call Call Type Triage / Clinical Relationship To Patient Self Return Phone Number [phone] (Primary) Chief Complaint Constipation Initial Comment Caller says he has had ABD pain mostly on left side. Has not had a BM in 4 days, not passing gas, Eating makes it worse. No appetite, he is very tired GOTO Facility Not Listed [facility] PreDisposition Go to ED Nurse Assessment Nurse: Harlon FlorWhitaker, RN, Darl PikesSusan Date/Time (Eastern Time): 05/23/2014 10:54:38 AM Confirm and document reason for call. If symptomatic, describe symptoms. ---Caller says he has had ABD pain mostly on left side. Has not had a BM in 4 days, not passing gas, Eating makes it worsw. No appetite, he is very tired . Last urine was at 6:30am Current temp: no thermometer. He was  chilled. Has the patient traveled out of the country within the last 30 days? ---No Does the patient require triage? ---Yes Related visit to physician within the last 2 weeks? ---No Does the PT have any chronic conditions? (i.e. diabetes, asthma, etc.) ---Yes List chronic conditions. ---MVI never had a C scope Guidelines Guideline Title Affirmed Question Affirmed Notes Nurse Date/Time (Eastern Time) Vomiting [1] SEVERE vomiting (e.g., 6 or more times/ day) AND [2] present > 8 hours Harlon FlorWhitaker, RN, Darl PikesSusan 05/23/2014 10:57:53 AM Disp. Time Lamount Cohen(Eastern Time) Disposition Final User 05/23/2014 10:58:52 AM Go to ED Now (or PCP triage) Yes Harlon FlorWhitaker, RN, Helane RimaSusan Caller Understands: Yes PLEASE NOTE: All timestamps contained within this report are represented as [place] Standard Time. CONFIDENTIALTY NOTICE: This fax transmission is intended only for the addressee. It contains information that is legally privileged, confidential or otherwise protected from use or disclosure. If you are not the intended recipient, you are strictly prohibited from reviewing, disclosing, copying using or disseminating any of this information or taking any action in reliance on or regarding this information. If you have received this fax in error, please notify us immediately by telephone so that we can arrange for its return to us. Phone: [phone], Toll-Free: [phone], Fax: [phone] Page: 2 of 2 Call Id: [id number] Disagree/Comply: Comply Care Advice Given Per Guideline GO TO ED NOW (OR PCP TRIAGE): CONTAINER: You may wish to bring a bucket or container with you in case there is more vomiting during the drive. CARE ADVICE per Vomiting (Adult) guideline. After Care Instructions Given Call Event Type User Date / Time Description Referrals GO TO FACILITY OTHER - SPECIFY

## 2014-12-25 ENCOUNTER — Encounter: Payer: Self-pay | Admitting: Family Medicine

## 2014-12-25 ENCOUNTER — Ambulatory Visit (INDEPENDENT_AMBULATORY_CARE_PROVIDER_SITE_OTHER): Payer: BLUE CROSS/BLUE SHIELD | Admitting: Family Medicine

## 2014-12-25 VITALS — BP 128/76 | HR 80 | Temp 98.7°F | Wt 193.8 lb

## 2014-12-25 DIAGNOSIS — J029 Acute pharyngitis, unspecified: Secondary | ICD-10-CM | POA: Diagnosis not present

## 2014-12-25 DIAGNOSIS — I889 Nonspecific lymphadenitis, unspecified: Secondary | ICD-10-CM | POA: Insufficient documentation

## 2014-12-25 LAB — CBC WITH DIFFERENTIAL/PLATELET
BASOS ABS: 0 10*3/uL (ref 0.0–0.1)
Basophils Relative: 0.3 % (ref 0.0–3.0)
EOS ABS: 0.2 10*3/uL (ref 0.0–0.7)
Eosinophils Relative: 1.2 % (ref 0.0–5.0)
HEMATOCRIT: 46.3 % (ref 39.0–52.0)
Hemoglobin: 15.6 g/dL (ref 13.0–17.0)
LYMPHS PCT: 18.8 % (ref 12.0–46.0)
Lymphs Abs: 3 10*3/uL (ref 0.7–4.0)
MCHC: 33.7 g/dL (ref 30.0–36.0)
MCV: 86.5 fl (ref 78.0–100.0)
Monocytes Absolute: 1 10*3/uL (ref 0.1–1.0)
Monocytes Relative: 6 % (ref 3.0–12.0)
NEUTROS ABS: 11.9 10*3/uL — AB (ref 1.4–7.7)
NEUTROS PCT: 73.7 % (ref 43.0–77.0)
PLATELETS: 216 10*3/uL (ref 150.0–400.0)
RBC: 5.35 Mil/uL (ref 4.22–5.81)
RDW: 14.3 % (ref 11.5–15.5)
WBC: 16.1 10*3/uL — ABNORMAL HIGH (ref 4.0–10.5)

## 2014-12-25 LAB — POCT RAPID STREP A (OFFICE): RAPID STREP A SCREEN: NEGATIVE

## 2014-12-25 MED ORDER — AMOXICILLIN-POT CLAVULANATE 875-125 MG PO TABS: 1.0000 | ORAL_TABLET | Freq: Two times a day (BID) | ORAL | Status: AC

## 2014-12-25 NOTE — Progress Notes (Signed)
Pre visit review using our clinic review tool, if applicable. No additional management support is needed unless otherwise documented below in the visit note. 

## 2014-12-25 NOTE — Patient Instructions (Addendum)
You have swollen gland of neck.  Blood work today to look for infection.  Strep test was negative today. Take augmentin antibiotic.  Work on quitting smoking. If no better into next week, return for recheck. If worsening over weekend, seek urgent care. Take ibuprofen 400mg  twice daily for discomfort as needed, with food

## 2014-12-25 NOTE — Addendum Note (Signed)
Addended by: Eustaquio BoydenGUTIERREZ, Levander Katzenstein on: 12/25/2014 10:16 AM   Modules accepted: Kipp BroodSmartSet

## 2014-12-25 NOTE — Assessment & Plan Note (Signed)
Of 3d duration, tender and swollen LAD but no fever. No viral URI sxs. No mycobaterium risk factors. Cover for bacterial lymphadenitis with augmentin course. Check stat CBC - if markedly elevated consider imaging. In smoker, if no improvement will need further imaging - discussed this with patient. Discussed red flags to seek care over weekend, if no better by next week, rec return for recheck. Pt and wife agree with plan.

## 2014-12-25 NOTE — Progress Notes (Signed)
BP 128/76 mmHg  Pulse 80  Temp(Src) 98.7 F (37.1 C) (Oral)  Wt 193 lb 12 oz (87.884 kg)   CC: ST, ear pain  Subjective:    Patient ID: Richard [name], male    DOB: 26-Apr-1960, 54 y.o.   MRN: [id number]  HPI: KAESON KLEINERT [name] is a 54 y.o. male presenting on 12/25/2014 for Sore Throat   Presents with wife.  3d h/o ST, neck pain and ear feels uncomfortable. Has been using chloraseptic lozenge/spray which hasn't helped. Iced tea/ice water helps. No change in voice. Painful to swallow. No pain with opening or closing mouth.   No fevers/chills, congestion, rhinorrhea, coughing, ear pain or drainage. No hearing changes. No tooth pain. No headaches., abd pain or nausea.  Current smoker 1 ppd.  No sick contacts at home. No h/o asthma or COPD.  No recent travel. No TB exposure. No work at prisons or homeless shelter.  Relevant past medical, surgical, family and social history reviewed and updated as indicated. Interim medical history since our last visit reviewed. Allergies and medications reviewed and updated. Current Outpatient Prescriptions on File Prior to Visit  Medication Sig  . Multiple Vitamin (MULTIVITAMIN) tablet Take 1 tablet by mouth daily.  Marland Kitchen omega-3 fish oil (MAXEPA) 1000 MG CAPS capsule Take 1 capsule by mouth daily.   No current facility-administered medications on file prior to visit.    Review of Systems Per HPI unless specifically indicated above     Objective:    BP 128/76 mmHg  Pulse 80  Temp(Src) 98.7 F (37.1 C) (Oral)  Wt 193 lb 12 oz (87.884 kg)  Wt Readings from Last 3 Encounters:  12/25/14 193 lb 12 oz (87.884 kg)  05/23/14 200 lb (90.719 kg)  05/06/14 199 lb 8 oz (90.493 kg)    Physical Exam  Constitutional: He appears well-developed and well-nourished. No distress.  Nontoxic. Overall well appearing.  HENT:  Head: Normocephalic and atraumatic.  Right Ear: Hearing, tympanic membrane, external ear and ear canal normal.  Left Ear: Hearing,  tympanic membrane, external ear and ear canal normal.  Nose: Nose normal. No mucosal edema or rhinorrhea. Right sinus exhibits no maxillary sinus tenderness and no frontal sinus tenderness. Left sinus exhibits no maxillary sinus tenderness and no frontal sinus tenderness.  Mouth/Throat: Uvula is midline and mucous membranes are normal. Posterior oropharyngeal edema and posterior oropharyngeal erythema present. No oropharyngeal exudate or tonsillar abscesses.  Able to swallow  No trismus  Eyes: Conjunctivae and EOM are normal. Pupils are equal, round, and reactive to light. No scleral icterus.  Neck: Normal range of motion. Neck supple.    FROM at neck, tenderness with left lateral rotation  Lymphadenopathy:       Head (right side): No submental, no submandibular, no tonsillar, no preauricular and no posterior auricular adenopathy present.       Head (left side): No submental, no submandibular, no tonsillar, no preauricular and no posterior auricular adenopathy present.    He has cervical adenopathy.       Right cervical: No deep cervical adenopathy present.      Left cervical: Superficial cervical (~2cm tender LAD) adenopathy present.  Skin: Skin is warm and dry. No rash noted.  Nursing note and vitals reviewed. RST negative    Assessment & Plan:   Problem List Items Addressed This Visit    Cervical lymphadenitis - Primary    Of 3d duration, tender and swollen LAD but no fever. No viral URI sxs.  No mycobaterium risk factors. Cover for bacterial lymphadenitis with augmentin course. Check stat CBC - if markedly elevated consider imaging. In smoker, if no improvement will need further imaging - discussed this with patient. Discussed red flags to seek care over weekend, if no better by next week, rec return for recheck. Pt and wife agree with plan.      Relevant Orders   CBC with Differential/Platelet    Other Visit Diagnoses    Sore throat        Relevant Orders    POCT rapid strep  A        Follow up plan: Return if symptoms worsen or fail to improve.

## 2015-05-12 ENCOUNTER — Ambulatory Visit (INDEPENDENT_AMBULATORY_CARE_PROVIDER_SITE_OTHER): Payer: BLUE CROSS/BLUE SHIELD | Admitting: Internal Medicine

## 2015-05-12 ENCOUNTER — Other Ambulatory Visit: Payer: Self-pay | Admitting: Internal Medicine

## 2015-05-12 ENCOUNTER — Encounter: Payer: Self-pay | Admitting: Internal Medicine

## 2015-05-12 VITALS — BP 120/74 | HR 66 | Temp 98.7°F | Ht 69.0 in | Wt 198.0 lb

## 2015-05-12 DIAGNOSIS — Z0000 Encounter for general adult medical examination without abnormal findings: Secondary | ICD-10-CM | POA: Diagnosis not present

## 2015-05-12 LAB — HEPATITIS C ANTIBODY: HCV AB: NEGATIVE

## 2015-05-12 NOTE — Progress Notes (Signed)
Subjective:    Patient ID: Richard [name], male    DOB: Aug 01, 1960, 55 y.o.   MRN: [id number]  HPI  Pt presents to the clinic today for his annual exam.  Flu: never Tetanus: 2015 PSA Screening: 04/2014 Colon Screening: never Vision Screening: within the last year Dentist: biannually  Diet: He does consume meat. He eats fruits and veggies a few times per week. He tries to avoid fried foods. He drinks mostly coffee, tea, soda, milk, water and juice. Exercise: none  Review of Systems      No past medical history on file.  Current Outpatient Prescriptions  Medication Sig Dispense Refill  . Multiple Vitamin (MULTIVITAMIN) tablet Take 1 tablet by mouth daily.    Marland Kitchen omega-3 fish oil (MAXEPA) 1000 MG CAPS capsule Take 1 capsule by mouth daily.     No current facility-administered medications for this visit.    No Known Allergies  Family History  Problem Relation Age of Onset  . Cancer Mother     liver  . Cancer Maternal Uncle     bone  . Diabetes Paternal Grandmother   . Early death Neg Hx   . Hypertension Neg Hx   . Stroke Neg Hx     Social History   Social History  . Marital Status: Married    Spouse Name: N/A  . Number of Children: N/A  . Years of Education: N/A   Occupational History  . Not on file.   Social History Main Topics  . Smoking status: Current Every Day Smoker -- 1.00 packs/day for 35 years    Types: Cigarettes  . Smokeless tobacco: Never Used  . Alcohol Use: No  . Drug Use: No  . Sexual Activity: Not on file   Other Topics Concern  . Not on file   Social History Narrative     Constitutional: Denies fever, malaise, fatigue, headache or abrupt weight changes.  HEENT: Denies eye pain, eye redness, ear pain, ringing in the ears, wax buildup, runny nose, nasal congestion, bloody nose, or sore throat. Respiratory: Denies difficulty breathing, shortness of breath, cough or sputum production.   Cardiovascular: Denies chest pain, chest  tightness, palpitations or swelling in the hands or feet.  Gastrointestinal: Denies abdominal pain, bloating, constipation, diarrhea or blood in the stool.  GU: Denies urgency, frequency, pain with urination, burning sensation, blood in urine, odor or discharge. Musculoskeletal: Denies decrease in range of motion, difficulty with gait, muscle pain or joint pain and swelling.  Skin: Denies redness, rashes, lesions or ulcercations.  Neurological: Denies dizziness, difficulty with memory, difficulty with speech or problems with balance and coordination.  Psych: Pt denies depression, anxiety, SI/HI.  No other specific complaints in a complete review of systems (except as listed in HPI above).  Objective:   Physical Exam   BP 120/74 mmHg  Pulse 66  Temp(Src) 98.7 F (37.1 C) (Oral)  Ht  (1.753 m)  Wt 198 lb (89.812 kg)  BMI 29.23 kg/m2  SpO2 96%  Wt Readings from Last 3 Encounters:  12/25/14 193 lb 12 oz (87.884 kg)  05/23/14 200 lb (90.719 kg)  05/06/14 199 lb 8 oz (90.493 kg)    General: Appears his stated age, well developed, well nourished in NAD. Skin: Warm, dry and intact. No rashes, lesions or ulcerations noted. HEENT: Head: normal shape and size; Eyes: sclera white, no icterus, conjunctiva pink, PERRLA and EOMs intact; Ears: Tm's gray and intact, normal light reflex; Throat/Mouth: Teeth present, mucosa  pink and moist, no exudate, lesions or ulcerations noted.  Neck: Neck supple, trachea midline. No masses, lumps or thyromegaly present.  Cardiovascular: Normal rate and rhythm. S1,S2 noted.  No murmur, rubs or gallops noted. No JVD or BLE edema. No carotid bruits noted. Pulmonary/Chest: Normal effort and positive vesicular breath sounds. No respiratory distress. No wheezes, rales or ronchi noted.  Abdomen: Soft and nontender. Normal bowel sounds. No distention or masses noted. Liver, spleen and kidneys non palpable. Musculoskeletal: Strength 5/5 BUE/BLE. No signs of joint  swelling. No difficulty with gait.  Neurological: Alert and oriented. Cranial nerves [name]-XII grossly intact. Coordination normal.  Psychiatric: Mood and affect normal. Behavior is normal. Judgment and thought content normal.     BMET    Component Value Date/Time   NA 137 05/23/2014 1344   K 3.7 05/23/2014 1344   CL 104 05/23/2014 1344   CO2 26 05/23/2014 1344   GLUCOSE 90 05/23/2014 1344   BUN 20 05/23/2014 1344   CREATININE 1.36* 05/23/2014 1344   CALCIUM 8.3* 05/23/2014 1344   GFRNONAA 58* 05/23/2014 1344   GFRAA 67* 05/23/2014 1344    Lipid Panel     Component Value Date/Time   CHOL 214* 05/06/2014 1500   TRIG 161.0* 05/06/2014 1500   HDL 35.00* 05/06/2014 1500   CHOLHDL 6 05/06/2014 1500   VLDL 32.2 05/06/2014 1500   LDLCALC 147* 05/06/2014 1500    CBC    Component Value Date/Time   WBC 16.1* 12/25/2014 1011   RBC 5.35 12/25/2014 1011   HGB 15.6 12/25/2014 1011   HCT 46.3 12/25/2014 1011   PLT 216.0 12/25/2014 1011   MCV 86.5 12/25/2014 1011   MCH 30.1 05/23/2014 1344   MCHC 33.7 12/25/2014 1011   RDW 14.3 12/25/2014 1011   LYMPHSABS 3.0 12/25/2014 1011   MONOABS 1.0 12/25/2014 1011   EOSABS 0.2 12/25/2014 1011   BASOSABS 0.0 12/25/2014 1011    Hgb A1C No results found for: HGBA1C      Assessment & Plan:   Preventative Health Maintenance:  He declines flu shot today Tetanus UTD Discussed Low radiation CT scan to evaluate for lung cancer (he declines at this time) Advised him to cut back smoking Encouraged him to work on diet and exercise Encouraged him to see an eye doctor and dentist at least annually  Will check CBC, CMET, Lipid profile,  A1C, HIV, Hep C and PSA  RTC in 1 year or sooner if needed

## 2015-05-12 NOTE — Patient Instructions (Signed)

## 2015-05-12 NOTE — Progress Notes (Signed)
Pre visit review using our clinic review tool, if applicable. No additional management support is needed unless otherwise documented below in the visit note. 

## 2015-05-13 LAB — COMPREHENSIVE METABOLIC PANEL
ALT: 15 U/L (ref 0–53)
AST: 15 U/L (ref 0–37)
Albumin: 4.2 g/dL (ref 3.5–5.2)
Alkaline Phosphatase: 45 U/L (ref 39–117)
BUN: 13 mg/dL (ref 6–23)
CO2: 27 meq/L (ref 19–32)
CREATININE: 0.99 mg/dL (ref 0.40–1.50)
Calcium: 9.2 mg/dL (ref 8.4–10.5)
Chloride: 109 mEq/L (ref 96–112)
GFR: 83.29 mL/min (ref >60.00)
GLUCOSE: 98 mg/dL (ref 70–99)
Potassium: 4.4 mEq/L (ref 3.5–5.1)
SODIUM: 140 meq/L (ref 135–145)
Total Bilirubin: 0.3 mg/dL (ref 0.2–1.2)
Total Protein: 6.6 g/dL (ref 6.0–8.3)

## 2015-05-13 LAB — LIPID PANEL
CHOL/HDL RATIO: 5
Cholesterol: 225 mg/dL — ABNORMAL HIGH (ref 0–200)
HDL: 41.6 mg/dL (ref >39.00)
LDL CALC: 156 mg/dL — AB (ref 0–99)
NonHDL: 183.23
Triglycerides: 135 mg/dL (ref 0.0–149.0)
VLDL: 27 mg/dL (ref 0.0–40.0)

## 2015-05-13 LAB — CBC
HCT: 45.6 % (ref 39.0–52.0)
Hemoglobin: 15.4 g/dL (ref 13.0–17.0)
MCHC: 33.8 g/dL (ref 30.0–36.0)
MCV: 86.7 fl (ref 78.0–100.0)
Platelets: 221 10*3/uL (ref 150.0–400.0)
RBC: 5.27 Mil/uL (ref 4.22–5.81)
RDW: 15.1 % (ref 11.5–15.5)
WBC: 10.1 10*3/uL (ref 4.0–10.5)

## 2015-05-13 LAB — PSA: PSA: 0.8 ng/mL (ref 0.10–4.00)

## 2015-05-13 LAB — HEMOGLOBIN A1C: HEMOGLOBIN A1C: 5.7 % (ref 4.6–6.5)

## 2015-05-13 LAB — HIV ANTIBODY (ROUTINE TESTING W REFLEX): HIV 1&2 Ab, 4th Generation: NONREACTIVE

## 2016-05-15 ENCOUNTER — Ambulatory Visit (INDEPENDENT_AMBULATORY_CARE_PROVIDER_SITE_OTHER): Payer: BLUE CROSS/BLUE SHIELD | Admitting: Internal Medicine

## 2016-05-15 ENCOUNTER — Encounter: Payer: Self-pay | Admitting: Internal Medicine

## 2016-05-15 VITALS — BP 124/76 | HR 76 | Temp 98.3°F | Ht 68.75 in | Wt 200.5 lb

## 2016-05-15 DIAGNOSIS — L918 Other hypertrophic disorders of the skin: Secondary | ICD-10-CM | POA: Diagnosis not present

## 2016-05-15 DIAGNOSIS — Z125 Encounter for screening for malignant neoplasm of prostate: Secondary | ICD-10-CM

## 2016-05-15 DIAGNOSIS — Z0000 Encounter for general adult medical examination without abnormal findings: Secondary | ICD-10-CM | POA: Diagnosis not present

## 2016-05-15 LAB — LIPID PANEL
CHOL/HDL RATIO: 6
Cholesterol: 209 mg/dL — ABNORMAL HIGH (ref 0–200)
HDL: 37.1 mg/dL — AB (ref >39.00)
LDL Cholesterol: 145 mg/dL — ABNORMAL HIGH (ref 0–99)
NonHDL: 171.58
Triglycerides: 135 mg/dL (ref 0.0–149.0)
VLDL: 27 mg/dL (ref 0.0–40.0)

## 2016-05-15 LAB — COMPREHENSIVE METABOLIC PANEL
ALBUMIN: 4.2 g/dL (ref 3.5–5.2)
ALT: 13 U/L (ref 0–53)
AST: 13 U/L (ref 0–37)
Alkaline Phosphatase: 52 U/L (ref 39–117)
BILIRUBIN TOTAL: 0.5 mg/dL (ref 0.2–1.2)
BUN: 20 mg/dL (ref 6–23)
CALCIUM: 9.2 mg/dL (ref 8.4–10.5)
CO2: 27 mEq/L (ref 19–32)
Chloride: 109 mEq/L (ref 96–112)
Creatinine, Ser: 1.06 mg/dL (ref 0.40–1.50)
GFR: 76.69 mL/min (ref >60.00)
Glucose, Bld: 89 mg/dL (ref 70–99)
Potassium: 4.3 mEq/L (ref 3.5–5.1)
Sodium: 140 mEq/L (ref 135–145)
Total Protein: 6.8 g/dL (ref 6.0–8.3)

## 2016-05-15 LAB — CBC
HCT: 46.6 % (ref 39.0–52.0)
Hemoglobin: 15.6 g/dL (ref 13.0–17.0)
MCHC: 33.5 g/dL (ref 30.0–36.0)
MCV: 87.4 fl (ref 78.0–100.0)
Platelets: 222 10*3/uL (ref 150.0–400.0)
RBC: 5.33 Mil/uL (ref 4.22–5.81)
RDW: 14.3 % (ref 11.5–15.5)
WBC: 8.9 10*3/uL (ref 4.0–10.5)

## 2016-05-15 LAB — PSA: PSA: 1.3 ng/mL (ref 0.10–4.00)

## 2016-05-15 LAB — HEMOGLOBIN A1C: HEMOGLOBIN A1C: 5.7 % (ref 4.6–6.5)

## 2016-05-15 NOTE — Progress Notes (Signed)
Subjective:    Patient ID: Richard [name], male    DOB: 09/07/1960, 56 y.o.   MRN: [id number]  HPI  Pt presents to the clinic today for his annual exam.  Flu: never Tetanus: 04/2013 PSA Screening: 05/2015 Colon Screening: never Vision Screening: yearly Dentist: biannually  Diet: He does eat meat. He consumes fruits and veggies some days. He tries to avoid fried foods. He drinks mostly sweet tea, water. Mt Dew and milk. Exercise: None  Review of Systems      No past medical history on file.  Current Outpatient Prescriptions  Medication Sig Dispense Refill  . Multiple Vitamin (MULTIVITAMIN) tablet Take 1 tablet by mouth daily.    Marland Kitchen omega-3 fish oil (MAXEPA) 1000 MG CAPS capsule Take 1 capsule by mouth daily.     No current facility-administered medications for this visit.     No Known Allergies  Family History  Problem Relation Age of Onset  . Cancer Mother     liver  . Cancer Maternal Uncle     bone  . Diabetes Paternal Grandmother   . Early death Neg Hx   . Hypertension Neg Hx   . Stroke Neg Hx     Social History   Social History  . Marital status: Married    Spouse name: N/A  . Number of children: N/A  . Years of education: N/A   Occupational History  . Not on file.   Social History Main Topics  . Smoking status: Current Every Day Smoker    Packs/day: 1.00    Years: 35.00    Types: Cigarettes  . Smokeless tobacco: Never Used  . Alcohol use No  . Drug use: No  . Sexual activity: Not on file   Other Topics Concern  . Not on file   Social History Narrative  . No narrative on file     Constitutional: Denies fever, malaise, fatigue, headache or abrupt weight changes.  HEENT: Denies eye pain, eye redness, ear pain, ringing in the ears, wax buildup, runny nose, nasal congestion, bloody nose, or sore throat. Respiratory: Denies difficulty breathing, shortness of breath, cough or sputum production.   Cardiovascular: Denies chest pain, chest  tightness, palpitations or swelling in the hands or feet.  Gastrointestinal: Denies abdominal pain, bloating, constipation, diarrhea or blood in the stool.  GU: Denies urgency, frequency, pain with urination, burning sensation, blood in urine, odor or discharge. Musculoskeletal: Denies decrease in range of motion, difficulty with gait, muscle pain or joint pain and swelling.  Skin: Pt reports skin tags. Denies redness, rashes, or ulcercations.  Neurological: Denies dizziness, difficulty with memory, difficulty with speech or problems with balance and coordination.  Psych: Denies anxiety, depression, SI/HI.  No other specific complaints in a complete review of systems (except as listed in HPI above).  Objective:   Physical Exam   BP 124/76   Pulse 76   Temp 98.3 F (36.8 C) (Oral)   Ht 5' 8.75" (1.746 m)   Wt 200 lb 8 oz (90.9 kg)   SpO2 97%   BMI 29.82 kg/m  Wt Readings from Last 3 Encounters:  05/15/16 200 lb 8 oz (90.9 kg)  05/12/15 198 lb (89.8 kg)  12/25/14 193 lb 12 oz (87.9 kg)    General: Appears his stated age, well developed, well nourished in NAD. Skin: Warm, dry and intact. Multiple skin tags upper chest, left side of neck.  HEENT: Head: normal shape and size; Eyes: sclera white, no icterus,  conjunctiva pink, PERRLA and EOMs intact; Ears: Tm's gray and intact, normal light reflex; Throat/Mouth: Teeth present, mucosa pink and moist, no exudate, lesions or ulcerations noted.  Neck:  Neck supple, trachea midline. No masses, lumps or thyromegaly present.  Cardiovascular: Normal rate and rhythm. S1,S2 noted.  No murmur, rubs or gallops noted. No JVD or BLE edema. No carotid bruits noted. Pulmonary/Chest: Normal effort and positive vesicular breath sounds. No respiratory distress. No wheezes, rales or ronchi noted.  Abdomen: Soft and nontender. Normal bowel sounds. No distention or masses noted. Liver, spleen and kidneys non palpable. Musculoskeletal: Strength 5/5 BUE/BLE. No  difficulty with gait.  Neurological: Alert and oriented. Cranial nerves [name]-XII grossly intact. Coordination normal.  Psychiatric: Mood and affect normal. Behavior is normal. Judgment and thought content normal.     BMET    Component Value Date/Time   NA 140 05/12/2015 1511   K 4.4 05/12/2015 1511   CL 109 05/12/2015 1511   CO2 27 05/12/2015 1511   GLUCOSE 98 05/12/2015 1511   BUN 13 05/12/2015 1511   CREATININE 0.99 05/12/2015 1511   CALCIUM 9.2 05/12/2015 1511   GFRNONAA 58 (L) 05/23/2014 1344   GFRAA 67 (L) 05/23/2014 1344    Lipid Panel     Component Value Date/Time   CHOL 225 (H) 05/12/2015 1511   TRIG 135.0 05/12/2015 1511   HDL 41.60 05/12/2015 1511   CHOLHDL 5 05/12/2015 1511   VLDL 27.0 05/12/2015 1511   LDLCALC 156 (H) 05/12/2015 1511    CBC    Component Value Date/Time   WBC 10.1 05/12/2015 1511   RBC 5.27 05/12/2015 1511   HGB 15.4 05/12/2015 1511   HCT 45.6 05/12/2015 1511   PLT 221.0 05/12/2015 1511   MCV 86.7 05/12/2015 1511   MCH 30.1 05/23/2014 1344   MCHC 33.8 05/12/2015 1511   RDW 15.1 05/12/2015 1511   LYMPHSABS 3.0 12/25/2014 1011   MONOABS 1.0 12/25/2014 1011   EOSABS 0.2 12/25/2014 1011   BASOSABS 0.0 12/25/2014 1011    Hgb A1C Lab Results  Component Value Date   HGBA1C 5.7 05/12/2015           Assessment & Plan:   Preventative Health Maintenance:  He declines flu shot Tetanus UTD He declines colonoscopy but is agreeable to Cologuard- ordered Encouraged him to consume a balanced diet and exercise regimen  Advised him to see an eye doctor and dentist annually Will check CBC, CMET, Lipid, A1C and PSA today  Multiple Skin Tags:  Referral placed to dermatology  RTC in 1 year, sooner if needed

## 2016-05-15 NOTE — Patient Instructions (Signed)
 Health Maintenance, Male A healthy lifestyle and preventive care is important for your health and wellness. Ask your health care provider about what schedule of regular examinations is right for you. What should I know about weight and diet?  Eat a Healthy Diet  Eat plenty of vegetables, fruits, whole grains, low-fat dairy products, and lean protein.  Do not eat a lot of foods high in solid fats, added sugars, or salt. Maintain a Healthy Weight  Regular exercise can help you achieve or maintain a healthy weight. You should:  Do at least 150 minutes of exercise each week. The exercise should increase your heart rate and make you sweat (moderate-intensity exercise).  Do strength-training exercises at least twice a week. Watch Your Levels of Cholesterol and Blood Lipids  Have your blood tested for lipids and cholesterol every 5 years starting at 57 years of age. If you are at high risk for heart disease, you should start having your blood tested when you are 57 years old. You may need to have your cholesterol levels checked more often if:  Your lipid or cholesterol levels are high.  You are older than 57 years of age.  You are at high risk for heart disease. What should I know about cancer screening? Many types of cancers can be detected early and may often be prevented. Lung Cancer  You should be screened every year for lung cancer if:  You are a current smoker who has smoked for at least 30 years.  You are a former smoker who has quit within the past 15 years.  Talk to your health care provider about your screening options, when you should start screening, and how often you should be screened. Colorectal Cancer  Routine colorectal cancer screening usually begins at 57 years of age and should be repeated every 5-10 years until you are 57 years old. You may need to be screened more often if early forms of precancerous polyps or small growths are found. Your health care provider  may recommend screening at an earlier age if you have risk factors for colon cancer.  Your health care provider may recommend using home test kits to check for hidden blood in the stool.  A small camera at the end of a tube can be used to examine your colon (sigmoidoscopy or colonoscopy). This checks for the earliest forms of colorectal cancer. Prostate and Testicular Cancer  Depending on your age and overall health, your health care provider may do certain tests to screen for prostate and testicular cancer.  Talk to your health care provider about any symptoms or concerns you have about testicular or prostate cancer. Skin Cancer  Check your skin from head to toe regularly.  Tell your health care provider about any new moles or changes in moles, especially if:  There is a change in a mole's size, shape, or color.  You have a mole that is larger than a pencil eraser.  Always use sunscreen. Apply sunscreen liberally and repeat throughout the day.  Protect yourself by wearing long sleeves, pants, a wide-brimmed hat, and sunglasses when outside. What should I know about heart disease, diabetes, and high blood pressure?  If you are 18-39 years of age, have your blood pressure checked every 3-5 years. If you are 40 years of age or older, have your blood pressure checked every year. You should have your blood pressure measured twice-once when you are at a hospital or clinic, and once when you are not at   a hospital or clinic. Record the average of the two measurements. To check your blood pressure when you are not at a hospital or clinic, you can use:  An automated blood pressure machine at a pharmacy.  A home blood pressure monitor.  Talk to your health care provider about your target blood pressure.  If you are between 45-79 years old, ask your health care provider if you should take aspirin to prevent heart disease.  Have regular diabetes screenings by checking your fasting blood sugar  level.  If you are at a normal weight and have a low risk for diabetes, have this test once every three years after the age of 45.  If you are overweight and have a high risk for diabetes, consider being tested at a younger age or more often.  A one-time screening for abdominal aortic aneurysm (AAA) by ultrasound is recommended for men aged 65-75 years who are current or former smokers. What should I know about preventing infection? Hepatitis B  If you have a higher risk for hepatitis B, you should be screened for this virus. Talk with your health care provider to find out if you are at risk for hepatitis B infection. Hepatitis C  Blood testing is recommended for:  Everyone born from 1945 through 1965.  Anyone with known risk factors for hepatitis C. Sexually Transmitted Diseases (STDs)  You should be screened each year for STDs including gonorrhea and chlamydia if:  You are sexually active and are younger than 57 years of age.  You are older than 57 years of age and your health care provider tells you that you are at risk for this type of infection.  Your sexual activity has changed since you were last screened and you are at an increased risk for chlamydia or gonorrhea. Ask your health care provider if you are at risk.  Talk with your health care provider about whether you are at high risk of being infected with HIV. Your health care provider may recommend a prescription medicine to help prevent HIV infection. What else can I do?  Schedule regular health, dental, and eye exams.  Stay current with your vaccines (immunizations).  Do not use any tobacco products, such as cigarettes, chewing tobacco, and e-cigarettes. If you need help quitting, ask your health care provider.  Limit alcohol intake to no more than 2 drinks per day. One drink equals 12 ounces of beer, 5 ounces of wine, or 1 ounces of hard liquor.  Do not use street drugs.  Do not share needles.  Ask your health  care provider for help if you need support or information about quitting drugs.  Tell your health care provider if you often feel depressed.  Tell your health care provider if you have ever been abused or do not feel safe at home. This information is not intended to replace advice given to you by your health care provider. Make sure you discuss any questions you have with your health care provider. Document Released: 08/26/2007 Document Revised: 10/27/2015 Document Reviewed: 12/01/2014 Elsevier Interactive Patient Education  2017 [employer]  

## 2016-05-17 ENCOUNTER — Telehealth: Payer: Self-pay

## 2016-05-17 NOTE — Telephone Encounter (Signed)
Cologuard form has been faxed w/ demographics and insurance card attached 

## 2016-05-24 ENCOUNTER — Encounter: Payer: Self-pay | Admitting: Internal Medicine

## 2016-05-26 IMAGING — CT CT ABD-PELV W/ CM
2 of 5 series · 15 of 46 positions shown, 17 images · IV contrast (Omnipaque 300)
Comparison: None.

CLINICAL DATA: LLQ pain x 4 days . Last normal BM 5 days ago.
Decreased appetite, nausea, vomiting. Pt has surg hx of bilateral
hernia repair x 2. Last surgery approximately 10 years ago./bbj

EXAM:
CT ABDOMEN AND PELVIS WITH CONTRAST
TECHNIQUE: Multidetector CT imaging of the abdomen and pelvis was performed
using the standard protocol following bolus administration of
intravenous contrast.
CONTRAST:  100mL OMNIPAQUE IOHEXOL 300 MG/ML  SOLN

[Series 2: abd_pel_with 5.0 b40f · axial · 0.83mm/px · z∈[-487,-67]mm · 12 of 96 slices shown, 14 images]
[im 6/96  soft-tissue]
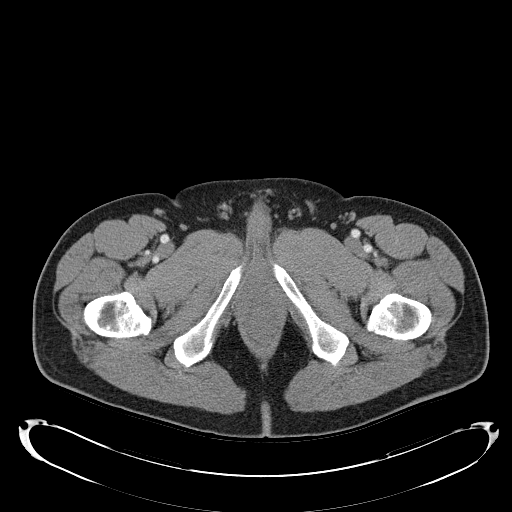
[im 6/96  bone]
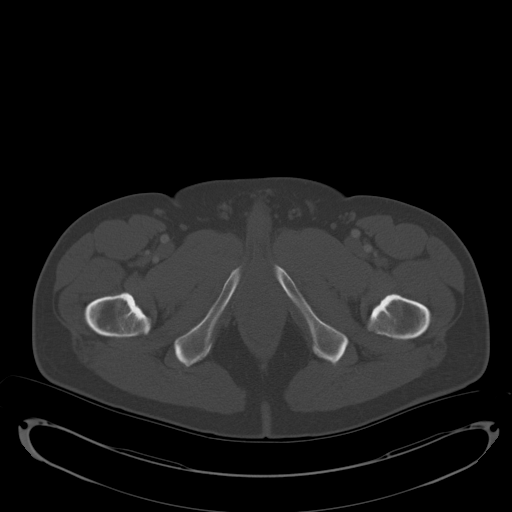
[im 16/96  soft-tissue]
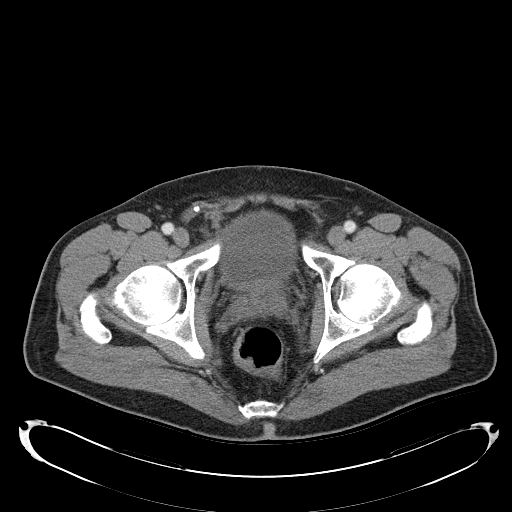
[im 22/96  soft-tissue]
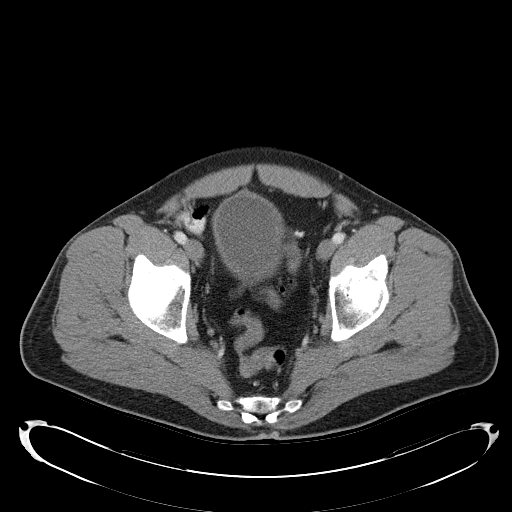
[im 27/96  soft-tissue]
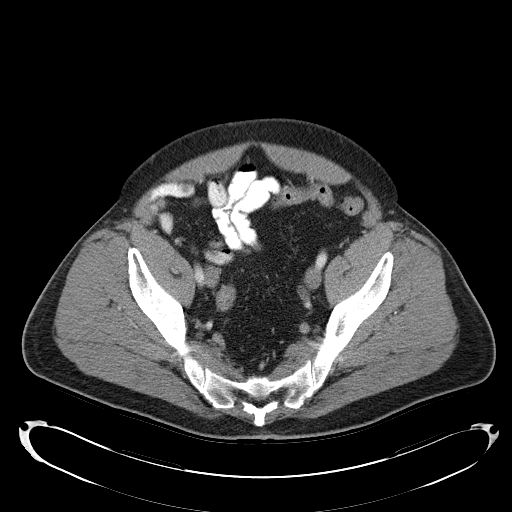
[im 37/96  soft-tissue]
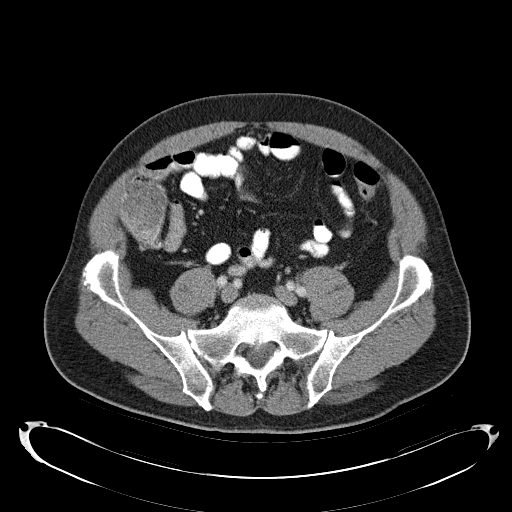
[im 43/96  soft-tissue]
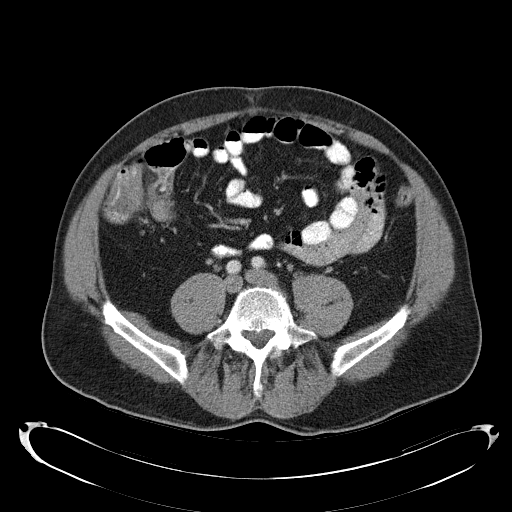
[im 53/96  soft-tissue]
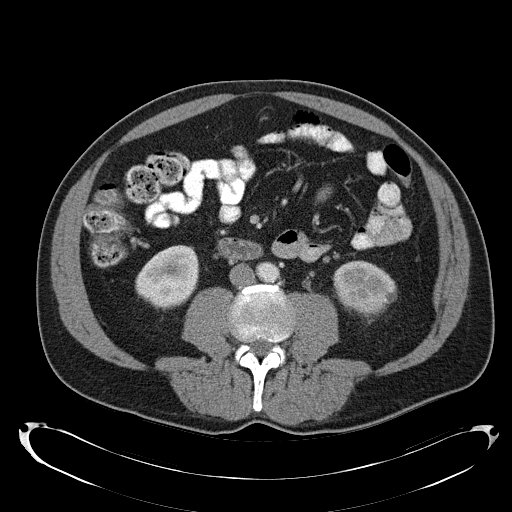
[im 59/96  soft-tissue]
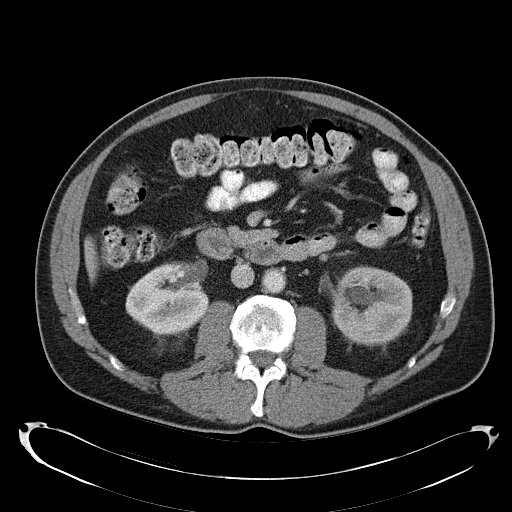
[im 69/96  soft-tissue]
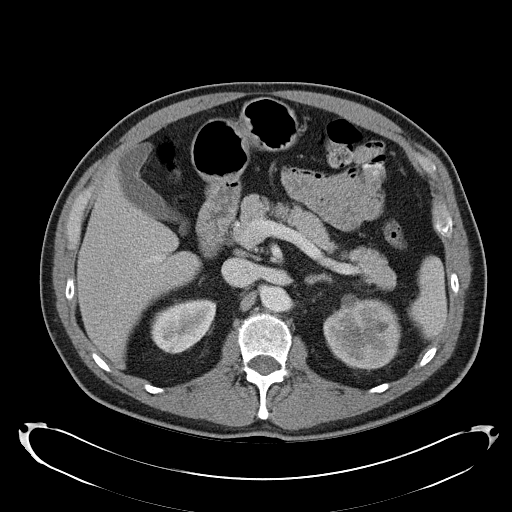
[im 69/96  bone]
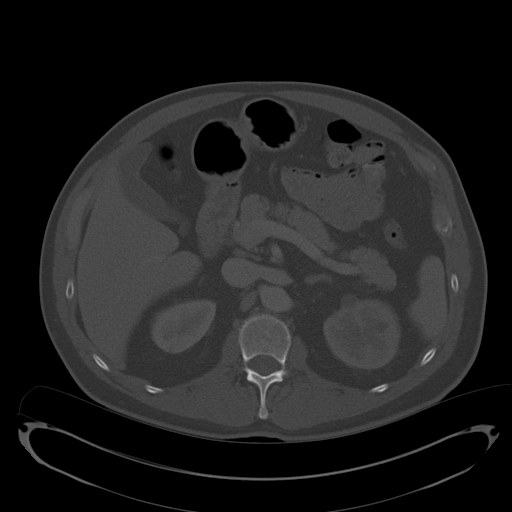
[im 74/96  soft-tissue]
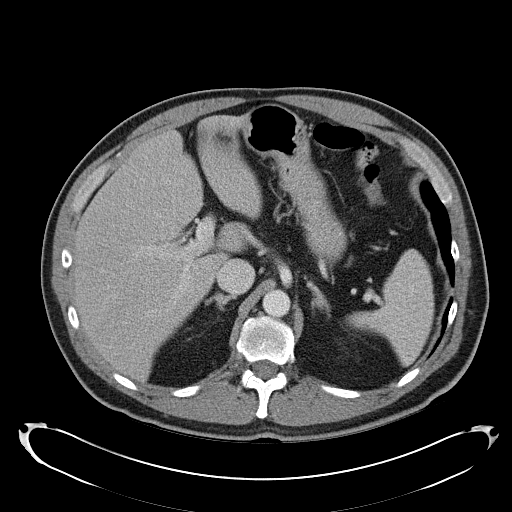
[im 80/96  soft-tissue]
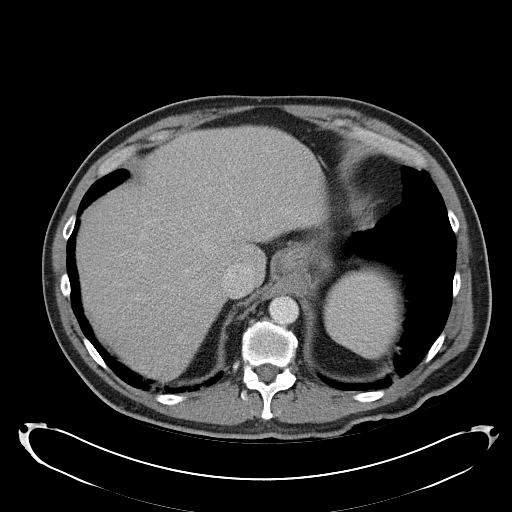
[im 90/96  soft-tissue]
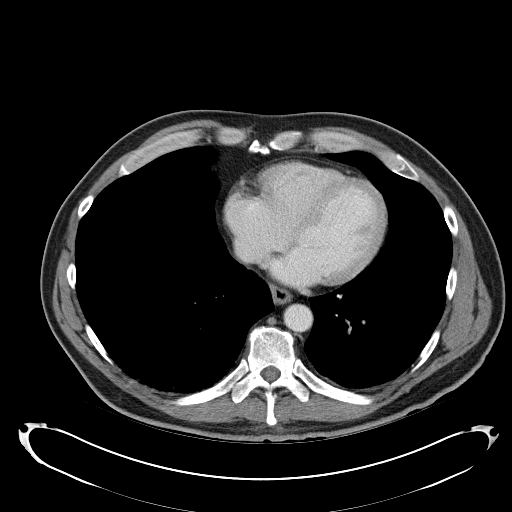

[Series 3: abd_pel_with 3.0 spo cor · coronal · 0.75mm/px · 3 of 99 slices shown]
[im 33/99  soft-tissue]
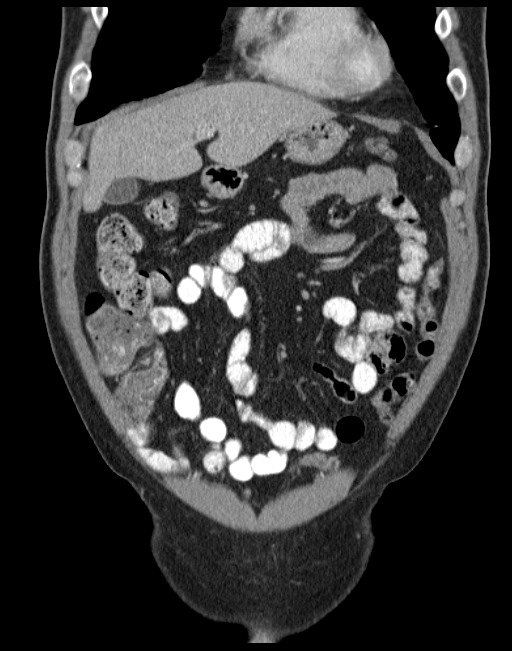
[im 44/99  soft-tissue]
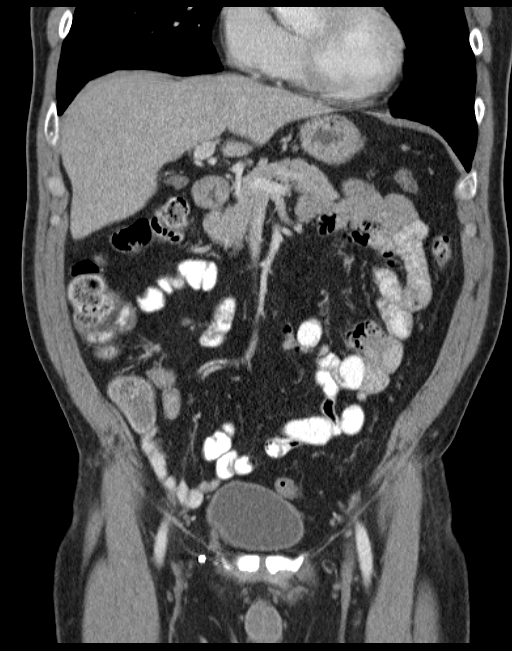
[im 55/99  soft-tissue]
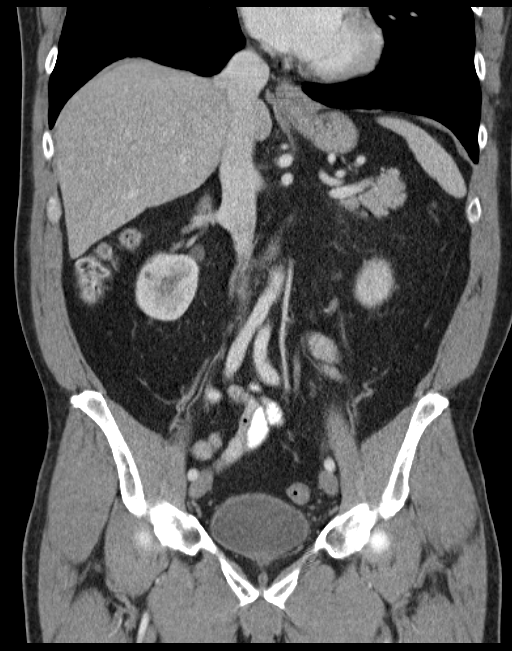

[15 of 46 positions shown; findings below may reference images not displayed]

FINDINGS: Lung bases demonstrates subtle posterior bibasilar scarring/
atelectasis. There is a 4.4 cm focus of herniated mesenteric fat
through a diaphragmatic rent over the medial right lower thorax.

Abdominal images demonstrate a normal liver, spleen, pancreas,
gallbladder and adrenal glands. Appendix is normal. Vascular
structures are unremarkable. There is mild diverticulosis of the
sigmoid colon.

Kidneys are normal in size. There is a 1-2 mm stone over the lower
pole of the left kidney. There is mild left-sided hydronephrosis
with mild dilatation of the left ureter as there is a 3.5 mm stone
at the left UVJ just inside the bladder. Subtle stranding of the
left perinephric fat and subtle decreased opacification of the left
renal cortex. No right renal or ureteral stones. There is a 1 cm
cyst over the lower pole of the left kidney. Multiple small sub cm
bilateral renal cortical hypodensities too small to characterize but
likely cysts. There is a homogeneous exophytic 1 cm nodule off the
upper pole of the right kidney with Hounsfield unit measurements of
74 likely a hyperdense cyst although indeterminate.

Remaining pelvic structures are notable for evidence of previous
right inguinal hernia repair. The rectum and prostate are
unremarkable.

There is mild age in change of the spine. There is a mild
compression deformity of L3 likely chronic.
IMPRESSION: Left-sided nephrolithiasis. 3.5 mm stone at the left UVJ just inside
the bladder causing low-grade obstruction.

1 cm cyst over the lower pole left kidney with multiple
subcentimeter renal cortical hypodensities likely cysts but too
small to characterize. 1 cm exophytic homogeneous nodule over the
upper pole right kidney which is indeterminate although likely a
hyperdense cyst. Recommend followup CT pre and post contrast vs MRI
for further evaluation.

Mild L3 compression fracture likely chronic.

Mild diverticulosis of the sigmoid colon.

## 2016-05-31 LAB — COLOGUARD

## 2016-06-16 ENCOUNTER — Encounter: Payer: Self-pay | Admitting: Internal Medicine

## 2016-12-29 LAB — CMP (EXT)
ALT/SGPT (EXT): 15 U/L (ref 10–55)
AST/SGOT (EXT): 22 U/L (ref 10–40)
Albumin (EXT): 4.5 g/dL (ref 3.3–5.0)
Alkaline Phosphatase (EXT): 85 U/L (ref 45–115)
Anion Gap (EXT): 11 mmol/L (ref 3–17)
BUN (EXT): 15 mg/dL (ref 8–25)
Bilirubin, Total (EXT): 2 mg/dL — ABNORMAL HIGH (ref 0.0–1.0)
CO2 (EXT): 27 mmol/L (ref 23–32)
CalciumCalcium (EXT): 9.4 mg/dL (ref 8.5–10.5)
Chloride (EXT): 101 mmol/L (ref 98–108)
Creatinine (EXT): 0.88 mg/dL (ref 0.60–1.50)
GFR Estimated (Calc) (EXT): 95 mL/min/{1.73_m2} (ref >59)
Globulin (EXT): 2.5 g/dL (ref 1.9–4.1)
Glucose (EXT): 104 mg/dL (ref 70–110)
Potassium (EXT): 3.8 mmol/L (ref 3.4–5.0)
Protein (EXT): 7 g/dL (ref 6.0–8.3)
Sodium (EXT): 139 mmol/L (ref 135–145)

## 2017-01-29 LAB — UNMAPPED LAB RESULTS
Albumin (EXT): 4.1 g/dL (ref 3.5–5.2)
CalciumCalcium (EXT): 8.5 mg/dL — ABNORMAL LOW (ref 8.8–10.7)

## 2017-01-30 LAB — UNMAPPED LAB RESULTS
Albumin (EXT): 3.9 g/dL (ref 3.5–5.2)
Albumin (EXT): 3.7 g/dL (ref 3.5–5.2)
Albumin (EXT): 3.7 g/dL (ref 3.5–5.2)
CalciumCalcium (EXT): 8.1 mg/dL — ABNORMAL LOW (ref 8.8–10.7)
CalciumCalcium (EXT): 8.6 mg/dL — ABNORMAL LOW (ref 8.8–10.7)
CalciumCalcium (EXT): 8.1 mg/dL — ABNORMAL LOW (ref 8.8–10.7)

## 2017-01-31 LAB — UNMAPPED LAB RESULTS
Albumin (EXT): 3.8 g/dL (ref 3.5–5.2)
Albumin (EXT): 3.5 g/dL (ref 3.5–5.2)
BUN (EXT): 20 mg/dL (ref 6–23)
CO2 (EXT): 27 mmol/L (ref 22–31)
CalciumCalcium (EXT): 8.3 mg/dL — ABNORMAL LOW (ref 8.8–10.7)
CalciumCalcium (EXT): 8.2 mg/dL — ABNORMAL LOW (ref 8.8–10.7)
Chloride (EXT): 98 mmol/L (ref 98–107)
Glucose (EXT): 116 mg/dL — ABNORMAL HIGH (ref 70–100)
Sodium (EXT): 139 mmol/L (ref 136–145)

## 2017-01-31 LAB — CREATININE W/ EGFR (EXT)
Creatinine (EXT): 0.97 mg/dL (ref 0.50–1.20)
GFR Estimated (Calc) (EXT): 86 mL/min/{1.73_m2} (ref >59)

## 2017-02-01 LAB — BMP (EXT)
Anion Gap (EXT): 13 mmol/L (ref 7–17)
BUN (EXT): 18 mg/dL (ref 6–23)
CO2 (EXT): 29 mmol/L (ref 22–31)
CalciumCalcium (EXT): 8.1 mg/dL — ABNORMAL LOW (ref 8.8–10.7)
Chloride (EXT): 99 mmol/L (ref 98–107)
Creatinine (EXT): 0.98 mg/dL (ref 0.50–1.20)
GFR Estimated (Calc) (EXT): 85 mL/min/{1.73_m2} (ref >59)
Glucose (EXT): 119 mg/dL — ABNORMAL HIGH (ref 70–100)
Potassium (EXT): 4.1 mmol/L (ref 3.4–5.1)
Sodium (EXT): 141 mmol/L (ref 136–145)

## 2017-02-01 LAB — UNMAPPED LAB RESULTS: Albumin (EXT): 3.6 g/dL (ref 3.5–5.2)

## 2017-02-06 LAB — UNMAPPED LAB RESULTS: CalciumCalcium (EXT): 9.2 mg/dL (ref 8.8–10.5)

## 2017-03-16 LAB — LIPID PROFILE (EXT)
Cholesterol (EXT): 198 mg/dL (ref <200)
HDL Cholesterol (EXT): 53 mg/dL (ref 35–100)
LDL Cholesterol (EXT): 136 mg/dL — ABNORMAL HIGH (ref 50–129)
NON HDL Cholesterol (EXT): 145 mg/dL
Risk Factor (EXT): 3.7 (ref 0.0–5.0)
Triglycerides (EXT): 47 mg/dL (ref 40–150)

## 2017-05-17 ENCOUNTER — Ambulatory Visit (INDEPENDENT_AMBULATORY_CARE_PROVIDER_SITE_OTHER): Payer: Managed Care, Other (non HMO) | Admitting: Internal Medicine

## 2017-05-17 ENCOUNTER — Encounter: Payer: Self-pay | Admitting: Internal Medicine

## 2017-05-17 VITALS — BP 124/82 | HR 76 | Temp 98.2°F | Wt 210.0 lb

## 2017-05-17 DIAGNOSIS — Z125 Encounter for screening for malignant neoplasm of prostate: Secondary | ICD-10-CM

## 2017-05-17 DIAGNOSIS — E782 Mixed hyperlipidemia: Secondary | ICD-10-CM

## 2017-05-17 DIAGNOSIS — Z0000 Encounter for general adult medical examination without abnormal findings: Secondary | ICD-10-CM

## 2017-05-17 LAB — LIPID PANEL
CHOLESTEROL: 230 mg/dL — AB (ref 0–200)
HDL: 48 mg/dL (ref >39.00)
NonHDL: 182.38
Total CHOL/HDL Ratio: 5
Triglycerides: 291 mg/dL — ABNORMAL HIGH (ref 0.0–149.0)
VLDL: 58.2 mg/dL — ABNORMAL HIGH (ref 0.0–40.0)

## 2017-05-17 LAB — COMPREHENSIVE METABOLIC PANEL
ALT: 16 U/L (ref 0–53)
AST: 12 U/L (ref 0–37)
Albumin: 4.3 g/dL (ref 3.5–5.2)
Alkaline Phosphatase: 47 U/L (ref 39–117)
BUN: 12 mg/dL (ref 6–23)
CO2: 28 mEq/L (ref 19–32)
Calcium: 9.7 mg/dL (ref 8.4–10.5)
Chloride: 105 mEq/L (ref 96–112)
Creatinine, Ser: 0.95 mg/dL (ref 0.40–1.50)
GFR: 86.72 mL/min (ref >60.00)
Glucose, Bld: 90 mg/dL (ref 70–99)
POTASSIUM: 4.5 meq/L (ref 3.5–5.1)
SODIUM: 139 meq/L (ref 135–145)
Total Bilirubin: 0.4 mg/dL (ref 0.2–1.2)
Total Protein: 7 g/dL (ref 6.0–8.3)

## 2017-05-17 LAB — CBC
HCT: 48.8 % (ref 39.0–52.0)
Hemoglobin: 16.8 g/dL (ref 13.0–17.0)
MCHC: 34.3 g/dL (ref 30.0–36.0)
MCV: 87.1 fl (ref 78.0–100.0)
Platelets: 227 10*3/uL (ref 150.0–400.0)
RBC: 5.6 Mil/uL (ref 4.22–5.81)
RDW: 14.7 % (ref 11.5–15.5)
WBC: 10.2 10*3/uL (ref 4.0–10.5)

## 2017-05-17 LAB — PSA: PSA: 2.03 ng/mL (ref 0.10–4.00)

## 2017-05-17 LAB — LDL CHOLESTEROL, DIRECT: LDL DIRECT: 151 mg/dL

## 2017-05-17 NOTE — Progress Notes (Signed)
Subjective:    Patient ID: Richard PicketJohn L Nix II, male    DOB: [birth year], 57 y.o.   MRN: [id number]  HPI  Pt presents to the clinic today for his annual exam.  HLD: His last LDL was 145, triglycerides 135, 05/2016. He is no longer taking Fish Oil.  Flu: never Tetanus: 04/2013 PSA Screening: 05/2016 Colon Screening: 05/2016 Cologuard Vision Screening: yearly Dentist: biannually  Diet: He does eat meat. He consumes some fruits and veggies. He rarely eats fried foods. He drinks mostly coffee and sweet tea. Exercise: None  Review of Systems      No past medical history on file.  Current Outpatient Medications  Medication Sig Dispense Refill  . Multiple Vitamin (MULTIVITAMIN) tablet Take 1 tablet by mouth daily.    Marland Kitchen. omega-3 fish oil (MAXEPA) 1000 MG CAPS capsule Take 1 capsule by mouth daily.     No current facility-administered medications for this visit.     No Known Allergies  Family History  Problem Relation Age of Onset  . Cancer Mother        liver  . Cancer Maternal Uncle        bone  . Diabetes Paternal Grandmother   . Early death Neg Hx   . Hypertension Neg Hx   . Stroke Neg Hx     Social History   Socioeconomic History  . Marital status: Widowed    Spouse name: Not on file  . Number of children: Not on file  . Years of education: Not on file  . Highest education level: Not on file  Social Needs  . Financial resource strain: Not on file  . Food insecurity - worry: Not on file  . Food insecurity - inability: Not on file  . Transportation needs - medical: Not on file  . Transportation needs - non-medical: Not on file  Occupational History  . Not on file  Tobacco Use  . Smoking status: Current Every Day Smoker    Packs/day: 1.00    Years: 35.00    Pack years: 35.00    Types: Cigarettes  . Smokeless tobacco: Never Used  Substance and Sexual Activity  . Alcohol use: No    Alcohol/week: 0.0 oz  . Drug use: No  . Sexual activity: Not on file  Other  Topics Concern  . Not on file  Social History Narrative  . Not on file     Constitutional: Denies fever, malaise, fatigue, headache or abrupt weight changes.  HEENT: Denies eye pain, eye redness, ear pain, ringing in the ears, wax buildup, runny nose, nasal congestion, bloody nose, or sore throat. Respiratory: Denies difficulty breathing, shortness of breath, cough or sputum production.   Cardiovascular: Denies chest pain, chest tightness, palpitations or swelling in the hands or feet.  Gastrointestinal: Pt reports intermittent reflux. Denies abdominal pain, bloating, constipation, diarrhea or blood in the stool.  GU: Denies urgency, frequency, pain with urination, burning sensation, blood in urine, odor or discharge. Musculoskeletal: Denies decrease in range of motion, difficulty with gait, muscle pain or joint pain and swelling.  Skin: Denies redness, rashes, lesions or ulcercations.  Neurological: Denies dizziness, difficulty with memory, difficulty with speech or problems with balance and coordination.  Psych: Denies anxiety, depression, SI/HI.  No other specific complaints in a complete review of systems (except as listed in HPI above).  Objective:   Physical Exam   BP 124/82   Pulse 76   Temp 98.2 F (36.8 C) (Oral)   Wt  210 lb (95.3 kg)   SpO2 97%   BMI 31.24 kg/m  Wt Readings from Last 3 Encounters:  05/17/17 210 lb (95.3 kg)  05/15/16 200 lb 8 oz (90.9 kg)  05/12/15 198 lb (89.8 kg)    General: Appears his stated age, well developed, well nourished in NAD. Skin: Warm, dry and intact. HEENT: Head: normal shape and size; Eyes: sclera white, no icterus, conjunctiva pink, PERRLA and EOMs intact; Ears: Tm's gray and intact, normal light reflex; Throat/Mouth: Teeth present, mucosa pink and moist, no exudate, lesions or ulcerations noted.  Neck:  Neck supple, trachea midline. No masses, lumps or thyromegaly present.  Cardiovascular: Normal rate and rhythm. S1,S2 noted.  No  murmur, rubs or gallops noted. No JVD or BLE edema. No carotid bruits noted. Pulmonary/Chest: Normal effort and positive vesicular breath sounds. No respiratory distress. No wheezes, rales or ronchi noted.  Abdomen: Soft and nontender. Normal bowel sounds. Ventral hernia noted. Liver, spleen and kidneys non palpable. Musculoskeletal: Strength 5/5 BUE/BLE. No difficulty with gait.  Neurological: Alert and oriented. Cranial nerves II-XII grossly intact. Coordination normal.  Psychiatric: Mood and affect normal. Behavior is normal. Judgment and thought content normal.     BMET    Component Value Date/Time   NA 140 05/15/2016 1501   K 4.3 05/15/2016 1501   CL 109 05/15/2016 1501   CO2 27 05/15/2016 1501   GLUCOSE 89 05/15/2016 1501   BUN 20 05/15/2016 1501   CREATININE 1.06 05/15/2016 1501   CALCIUM 9.2 05/15/2016 1501   GFRNONAA 58 (L) 05/23/2014 1344   GFRAA 67 (L) 05/23/2014 1344    Lipid Panel     Component Value Date/Time   CHOL 209 (H) 05/15/2016 1501   TRIG 135.0 05/15/2016 1501   HDL 37.10 (L) 05/15/2016 1501   CHOLHDL 6 05/15/2016 1501   VLDL 27.0 05/15/2016 1501   LDLCALC 145 (H) 05/15/2016 1501    CBC    Component Value Date/Time   WBC 8.9 05/15/2016 1501   RBC 5.33 05/15/2016 1501   HGB 15.6 05/15/2016 1501   HCT 46.6 05/15/2016 1501   PLT 222.0 05/15/2016 1501   MCV 87.4 05/15/2016 1501   MCH 30.1 05/23/2014 1344   MCHC 33.5 05/15/2016 1501   RDW 14.3 05/15/2016 1501   LYMPHSABS 3.0 12/25/2014 1011   MONOABS 1.0 12/25/2014 1011   EOSABS 0.2 12/25/2014 1011   BASOSABS 0.0 12/25/2014 1011    Hgb A1C Lab Results  Component Value Date   HGBA1C 5.7 05/15/2016           Assessment & Plan:   Preventative Health Maintenance:  He declines flu shot today Tetanus UTD Colon screening UTD Encouraged him to consume a balanced diet and exercise regimen Advised him to see and eye doctor and dentist annually Will check CBC, CMET, Lipid, and PSA  today  RTC in 1 year, sooner if needed Nicki Reaper, NP

## 2017-05-17 NOTE — Patient Instructions (Signed)

## 2017-05-23 ENCOUNTER — Encounter: Payer: Self-pay | Admitting: Internal Medicine

## 2017-05-23 NOTE — Addendum Note (Signed)
Addended by: Roena MaladyEVONTENNO, Velisa Regnier Y on: 05/23/2017 12:49 PM   Modules accepted: Orders

## 2017-08-22 ENCOUNTER — Other Ambulatory Visit (INDEPENDENT_AMBULATORY_CARE_PROVIDER_SITE_OTHER): Payer: Managed Care, Other (non HMO)

## 2017-08-22 DIAGNOSIS — E782 Mixed hyperlipidemia: Secondary | ICD-10-CM | POA: Diagnosis not present

## 2017-08-22 LAB — LIPID PANEL
Cholesterol: 235 mg/dL — ABNORMAL HIGH (ref 0–200)
HDL: 36.6 mg/dL — ABNORMAL LOW (ref >39.00)
NonHDL: 198.49
Total CHOL/HDL Ratio: 6
Triglycerides: 244 mg/dL — ABNORMAL HIGH (ref 0.0–149.0)
VLDL: 48.8 mg/dL — ABNORMAL HIGH (ref 0.0–40.0)

## 2017-08-22 LAB — LDL CHOLESTEROL, DIRECT: LDL DIRECT: 166 mg/dL

## 2018-04-28 LAB — BMP (EXT)
Anion Gap (EXT): 12 mmol/L (ref 3–17)
BUN (EXT): 19 mg/dL (ref 8–25)
CO2 (EXT): 27 mmol/L (ref 23–32)
CalciumCalcium (EXT): 9 mg/dL (ref 8.5–10.5)
Chloride (EXT): 103 mmol/L (ref 98–108)
Creatinine (EXT): 0.9 mg/dL (ref 0.60–1.50)
GFR Estimated (Calc) (EXT): 94 mL/min/{1.73_m2} (ref >59)
Glucose (EXT): 83 mg/dL (ref 70–110)
Potassium (EXT): 3.7 mmol/L (ref 3.4–5.0)
Sodium (EXT): 142 mmol/L (ref 135–145)

## 2018-05-20 ENCOUNTER — Encounter: Payer: Self-pay | Admitting: Internal Medicine

## 2018-05-20 ENCOUNTER — Ambulatory Visit (INDEPENDENT_AMBULATORY_CARE_PROVIDER_SITE_OTHER): Payer: Managed Care, Other (non HMO) | Admitting: Internal Medicine

## 2018-05-20 VITALS — BP 140/68 | HR 89 | Temp 98.2°F | Ht 68.75 in | Wt 208.8 lb

## 2018-05-20 DIAGNOSIS — Z0001 Encounter for general adult medical examination with abnormal findings: Secondary | ICD-10-CM | POA: Diagnosis not present

## 2018-05-20 DIAGNOSIS — E7800 Pure hypercholesterolemia, unspecified: Secondary | ICD-10-CM | POA: Diagnosis not present

## 2018-05-20 DIAGNOSIS — Z125 Encounter for screening for malignant neoplasm of prostate: Secondary | ICD-10-CM | POA: Diagnosis not present

## 2018-05-20 NOTE — Progress Notes (Signed)
Pre visit review using our clinic review tool, if applicable. No additional management support is needed unless otherwise documented below in the visit note. 

## 2018-05-20 NOTE — Assessment & Plan Note (Signed)
CMET and Lipid profile today °Encouraged him to consume a low fat diet °

## 2018-05-20 NOTE — Progress Notes (Signed)
Subjective:    Patient ID: Richard [name], male    DOB: [birth year], 59 y.o.   MRN: [id number]  HPI  Pt presents to the clinic today for his annual exam.  Flu: never Tetanus: 05/2013 PSA Screening: 05/2017 Colon Screening: 05/2016 Vision Screening: annually Dentist: annually  Diet: He does eat meat. He consumes fruits and veggies daily. He tries to avoid fried foods. He drinks mostly water, coffee. Exercise: None  Review of Systems      No past medical history on file.  Current Outpatient Medications  Medication Sig Dispense Refill  . Multiple Vitamin (MULTIVITAMIN) tablet Take 1 tablet by mouth daily.    Marland Kitchen omega-3 fish oil (MAXEPA) 1000 MG CAPS capsule Take 1 capsule by mouth daily.     No current facility-administered medications for this visit.     No Known Allergies  Family History  Problem Relation Age of Onset  . Cancer Mother        liver  . Cancer Maternal Uncle        bone  . Diabetes Paternal Grandmother   . Early death Neg Hx   . Hypertension Neg Hx   . Stroke Neg Hx     Social History   Socioeconomic History  . Marital status: Widowed    Spouse name: Not on file  . Number of children: Not on file  . Years of education: Not on file  . Highest education level: Not on file  Occupational History  . Not on file  Social Needs  . Financial resource strain: Not on file  . Food insecurity:    Worry: Not on file    Inability: Not on file  . Transportation needs:    Medical: Not on file    Non-medical: Not on file  Tobacco Use  . Smoking status: Current Every Day Smoker    Packs/day: 1.00    Years: 35.00    Pack years: 35.00    Types: Cigarettes  . Smokeless tobacco: Never Used  Substance and Sexual Activity  . Alcohol use: No    Alcohol/week: 0.0 standard drinks  . Drug use: No  . Sexual activity: Not on file  Lifestyle  . Physical activity:    Days per week: Not on file    Minutes per session: Not on file  . Stress: Not on file    Relationships  . Social connections:    Talks on phone: Not on file    Gets together: Not on file    Attends religious service: Not on file    Active member of club or organization: Not on file    Attends meetings of clubs or organizations: Not on file    Relationship status: Not on file  . Intimate partner violence:    Fear of current or ex partner: Not on file    Emotionally abused: Not on file    Physically abused: Not on file    Forced sexual activity: Not on file  Other Topics Concern  . Not on file  Social History Narrative  . Not on file     Constitutional: Denies fever, malaise, fatigue, headache or abrupt weight changes.  HEENT: Denies eye pain, eye redness, ear pain, ringing in the ears, wax buildup, runny nose, nasal congestion, bloody nose, or sore throat. Respiratory: Denies difficulty breathing, shortness of breath, cough or sputum production.   Cardiovascular: Denies chest pain, chest tightness, palpitations or swelling in the hands or feet.  Gastrointestinal: Denies  abdominal pain, bloating, constipation, diarrhea or blood in the stool.  GU: Denies urgency, frequency, pain with urination, burning sensation, blood in urine, odor or discharge. Musculoskeletal: Pt reports intermittent joint pain. Denies decrease in range of motion, difficulty with gait, muscle pain or joint swelling.  Skin: Denies redness, rashes, lesions or ulcercations.  Neurological: Denies dizziness, difficulty with memory, difficulty with speech or problems with balance and coordination.  Psych: Denies anxiety, depression, SI/HI.  No other specific complaints in a complete review of systems (except as listed in HPI above).  Objective:   Physical Exam   BP 140/68   Pulse 89   Temp 98.2 F (36.8 C)   Ht 5' 8.75" (1.746 m)   Wt 208 lb 12.8 oz (94.7 kg)   SpO2 94%   BMI 31.06 kg/m  Wt Readings from Last 3 Encounters:  05/20/18 208 lb 12.8 oz (94.7 kg)  05/17/17 210 lb (95.3 kg)   05/15/16 200 lb 8 oz (90.9 kg)    General: Appears his stated age, well developed, well nourished in NAD. Skin: Warm, dry and intact.  HEENT: Head: normal shape and size; Eyes: sclera white, no icterus, conjunctiva pink, PERRLA and EOMs intact; Ears: Tm's gray and intact, normal light reflex; Throat/Mouth: Teeth present, mucosa pink and moist, no exudate, lesions or ulcerations noted.  Neck:  Neck supple, trachea midline. No masses, lumps or thyromegaly present.  Cardiovascular: Normal rate and rhythm. S1,S2 noted.  No murmur, rubs or gallops noted. No JVD or BLE edema. No carotid bruits noted. Pulmonary/Chest: Normal effort and positive vesicular breath sounds. No respiratory distress. No wheezes, rales or ronchi noted.  Abdomen: Soft and nontender. Normal bowel sounds. Ventral hernia noted. Liver, spleen and kidneys non palpable. Musculoskeletal: Strength 5/5 BUE/BLE. No difficulty with gait.  Neurological: Alert and oriented. Cranial nerves [name]-XII grossly intact. Coordination normal.  Psychiatric: Mood and affect normal. Behavior is normal. Judgment and thought content normal.    BMET    Component Value Date/Time   NA 139 05/17/2017 1437   K 4.5 05/17/2017 1437   CL 105 05/17/2017 1437   CO2 28 05/17/2017 1437   GLUCOSE 90 05/17/2017 1437   BUN 12 05/17/2017 1437   CREATININE 0.95 05/17/2017 1437   CALCIUM 9.7 05/17/2017 1437   GFRNONAA 58 (L) 05/23/2014 1344   GFRAA 67 (L) 05/23/2014 1344    Lipid Panel     Component Value Date/Time   CHOL 235 (H) 08/22/2017 1450   TRIG 244.0 (H) 08/22/2017 1450   HDL 36.60 (L) 08/22/2017 1450   CHOLHDL 6 08/22/2017 1450   VLDL 48.8 (H) 08/22/2017 1450   LDLCALC 145 (H) 05/15/2016 1501    CBC    Component Value Date/Time   WBC 10.2 05/17/2017 1437   RBC 5.60 05/17/2017 1437   HGB 16.8 05/17/2017 1437   HCT 48.8 05/17/2017 1437   PLT 227.0 05/17/2017 1437   MCV 87.1 05/17/2017 1437   MCH 30.1 05/23/2014 1344   MCHC 34.3  05/17/2017 1437   RDW 14.7 05/17/2017 1437   LYMPHSABS 3.0 12/25/2014 1011   MONOABS 1.0 12/25/2014 1011   EOSABS 0.2 12/25/2014 1011   BASOSABS 0.0 12/25/2014 1011    Hgb A1C Lab Results  Component Value Date   HGBA1C 5.7 05/15/2016           Assessment & Plan:   Preventative Health Maintenance:  He declines flu shot today Tetanus UTD PSA today with labs Cologuard UTD Encouraged him to consume a balanced  diet and exercise regimen Advised him to see an eye doctor and dentist annually Will check CBC, CMET , Lipid and PSA today  RTC in 1 year, sooner if needed Nicki Reaperegina Lejon Afzal, NP

## 2018-05-20 NOTE — Patient Instructions (Signed)

## 2018-05-21 LAB — CBC
HCT: 47.4 % (ref 39.0–52.0)
Hemoglobin: 16.1 g/dL (ref 13.0–17.0)
MCHC: 34.1 g/dL (ref 30.0–36.0)
MCV: 87.1 fl (ref 78.0–100.0)
Platelets: 245 10*3/uL (ref 150.0–400.0)
RBC: 5.44 Mil/uL (ref 4.22–5.81)
RDW: 14.8 % (ref 11.5–15.5)
WBC: 9.1 10*3/uL (ref 4.0–10.5)

## 2018-05-21 LAB — LIPID PANEL
Cholesterol: 244 mg/dL — ABNORMAL HIGH (ref 0–200)
HDL: 35.1 mg/dL — ABNORMAL LOW (ref >39.00)
Total CHOL/HDL Ratio: 7
Triglycerides: 482 mg/dL — ABNORMAL HIGH (ref 0.0–149.0)

## 2018-05-21 LAB — COMPREHENSIVE METABOLIC PANEL
ALT: 20 U/L (ref 0–53)
AST: 15 U/L (ref 0–37)
Albumin: 4.6 g/dL (ref 3.5–5.2)
Alkaline Phosphatase: 52 U/L (ref 39–117)
BILIRUBIN TOTAL: 0.4 mg/dL (ref 0.2–1.2)
BUN: 23 mg/dL (ref 6–23)
CO2: 24 mEq/L (ref 19–32)
CREATININE: 0.95 mg/dL (ref 0.40–1.50)
Calcium: 9.6 mg/dL (ref 8.4–10.5)
Chloride: 106 mEq/L (ref 96–112)
GFR: 81.3 mL/min (ref >60.00)
Glucose, Bld: 98 mg/dL (ref 70–99)
Potassium: 4.5 mEq/L (ref 3.5–5.1)
SODIUM: 140 meq/L (ref 135–145)
Total Protein: 7.1 g/dL (ref 6.0–8.3)

## 2018-05-21 LAB — PSA: PSA: 1.19 ng/mL (ref 0.10–4.00)

## 2018-05-21 LAB — LDL CHOLESTEROL, DIRECT: LDL DIRECT: 163 mg/dL

## 2018-05-24 ENCOUNTER — Encounter: Payer: Self-pay | Admitting: Internal Medicine

## 2018-08-29 ENCOUNTER — Other Ambulatory Visit: Payer: Self-pay | Admitting: Internal Medicine

## 2018-08-29 ENCOUNTER — Other Ambulatory Visit: Payer: Managed Care, Other (non HMO)

## 2018-08-29 DIAGNOSIS — Z20822 Contact with and (suspected) exposure to covid-19: Secondary | ICD-10-CM

## 2018-08-29 NOTE — Progress Notes (Signed)
[id number] 

## 2018-09-04 LAB — NOVEL CORONAVIRUS, NAA: SARS-CoV-2, NAA: NOT DETECTED

## 2018-10-27 LAB — LIPID PROFILE (EXT)
Cholesterol (EXT): 169 mg/dL (ref <200)
HDL Cholesterol (EXT): 55 mg/dL (ref 35–100)
LDL Cholesterol (EXT): 97 mg/dL (ref 50–129)
NON HDL Cholesterol (EXT): 114 mg/dL
Risk Factor (EXT): 3.1 (ref 0.0–5.0)
Triglycerides (EXT): 83 mg/dL (ref 40–150)

## 2018-10-28 LAB — HEMOGLOBIN A1C
Estimated Average Glucose mg/dL (INT/EXT): 120 mg/dL
HEMOGLOBIN A1C % (INT/EXT): 5.8 % (ref 4.3–6.4)

## 2019-03-27 LAB — HEMOGLOBIN A1C: HEMOGLOBIN A1C % (INT/EXT): 6 % — ABNORMAL HIGH (ref <5.7)

## 2019-05-11 ENCOUNTER — Telehealth: Payer: Managed Care, Other (non HMO) | Admitting: Emergency Medicine

## 2019-05-11 DIAGNOSIS — J029 Acute pharyngitis, unspecified: Secondary | ICD-10-CM | POA: Diagnosis not present

## 2019-05-11 NOTE — Progress Notes (Signed)
We are sorry that you are not feeling well.  Here is how we plan to help!  Your symptoms indicate a likely viral infection (Pharyngitis).   Pharyngitis is inflammation in the back of the throat which can cause a sore throat, scratchiness and sometimes difficulty swallowing.   Pharyngitis is typically caused by a respiratory virus and will just run its course.  Please keep in mind that your symptoms could last up to 10 days.  For throat pain, we recommend over the counter oral pain relief medications such as acetaminophen or aspirin, or anti-inflammatory medications such as ibuprofen or naproxen sodium.  Topical treatments such as oral throat lozenges or sprays may be used as needed.  Avoid close contact with loved ones, especially the very young and elderly.  Remember to wash your hands thoroughly throughout the day as this is the number one way to prevent the spread of infection and wipe down door knobs and counters with disinfectant.  After careful review of your answers, I would not recommend and antibiotic for your condition.  Antibiotics should not be used to treat conditions that we suspect are caused by viruses like the virus that causes the common cold or flu. However, some people can have Strep with atypical symptoms. You may need formal testing in clinic or office to confirm if your symptoms continue or worsen.  Providers prescribe antibiotics to treat infections caused by bacteria. Antibiotics are very powerful in treating bacterial infections when they are used properly.  To maintain their effectiveness, they should be used only when necessary.  Overuse of antibiotics has resulted in the development of super bugs that are resistant to treatment!    Home Care:  Only take medications as instructed by your medical team.  Do not drink alcohol while taking these medications.  A steam or ultrasonic humidifier can help congestion.  You can place a towel over your head and breathe in the steam from  hot water coming from a faucet.  Avoid close contacts especially the very young and the elderly.  Cover your mouth when you cough or sneeze.  Always remember to wash your hands.  Get Help Right Away If:  You develop worsening fever or throat pain.  You develop a severe head ache or visual changes.  Your symptoms persist after you have completed your treatment plan.  You are unable to swallow liquids including your own saliva.  Make sure you  Understand these instructions.  Will watch your condition.  Will get help right away if you are not doing well or get worse.  Your e-visit answers were reviewed by a board certified advanced clinical practitioner to complete your personal care plan.  Depending on the condition, your plan could have included both over the counter or prescription medications.  If there is a problem please reply  once you have received a response from your provider.  Your safety is important to [place].  If you have drug allergies check your prescription carefully.    You can use MyChart to ask questions about todays visit, request a non-urgent call back, or ask for a work or school excuse for 24 hours related to this e-Visit. If it has been greater than 24 hours you will need to follow up with your provider, or enter a new e-Visit to address those concerns.  You will get an e-mail in the next two days asking about your experience.  I hope that your e-visit has been valuable and will speed your recovery.  Thank you for using e-visits.  Greater than 5 but less than 10 minutes spent researching, coordinating, and implementing care for this patient today

## 2019-05-21 ENCOUNTER — Ambulatory Visit (INDEPENDENT_AMBULATORY_CARE_PROVIDER_SITE_OTHER): Payer: Managed Care, Other (non HMO) | Admitting: Internal Medicine

## 2019-05-21 ENCOUNTER — Other Ambulatory Visit: Payer: Self-pay

## 2019-05-21 ENCOUNTER — Encounter: Payer: Self-pay | Admitting: Internal Medicine

## 2019-05-21 VITALS — BP 124/80 | HR 69 | Temp 97.3°F | Ht 69.0 in | Wt 222.0 lb

## 2019-05-21 DIAGNOSIS — E7800 Pure hypercholesterolemia, unspecified: Secondary | ICD-10-CM

## 2019-05-21 DIAGNOSIS — E010 Iodine-deficiency related diffuse (endemic) goiter: Secondary | ICD-10-CM | POA: Diagnosis not present

## 2019-05-21 DIAGNOSIS — Z0001 Encounter for general adult medical examination with abnormal findings: Secondary | ICD-10-CM

## 2019-05-21 DIAGNOSIS — Z125 Encounter for screening for malignant neoplasm of prostate: Secondary | ICD-10-CM

## 2019-05-21 NOTE — Patient Instructions (Signed)

## 2019-05-21 NOTE — Progress Notes (Signed)
Subjective:    Patient ID: Richard [name], male    DOB: 05/08/1960, 59 y.o.   MRN: [id number]  HPI  Pt presents to the clinic today for his annual exam.   HLD: His last LDL was 163, triglycerides 482, 05/2018. He declined to start cholesterol medication at that time but is taking Fish Oil OTC. He tries to consume a low fat diet.  Flu: never Tetanus: 04/2013 PSA Screening: 05/2018 Colon Screening: 05/2016 Cologuard Vision Screening: annually Dentist: biannually  Diet: He does eat meat. He consumes more fruits than veggies. He tries to avoid fried foods. He drinks mostly water, sweet tea, juice. Exercise: None   Review of Systems      No past medical history on file.  Current Outpatient Medications  Medication Sig Dispense Refill  . Multiple Vitamin (MULTIVITAMIN) tablet Take 1 tablet by mouth daily.    Marland Kitchen omega-3 fish oil (MAXEPA) 1000 MG CAPS capsule Take 1 capsule by mouth daily.     No current facility-administered medications for this visit.    No Known Allergies  Family History  Problem Relation Age of Onset  . Cancer Mother        liver  . Cancer Maternal Uncle        bone  . Diabetes Paternal Grandmother   . Early death Neg Hx   . Hypertension Neg Hx   . Stroke Neg Hx     Social History   Socioeconomic History  . Marital status: Widowed    Spouse name: Not on file  . Number of children: Not on file  . Years of education: Not on file  . Highest education level: Not on file  Occupational History  . Not on file  Tobacco Use  . Smoking status: Current Every Day Smoker    Packs/day: 1.00    Years: 35.00    Pack years: 35.00    Types: Cigarettes  . Smokeless tobacco: Never Used  Substance and Sexual Activity  . Alcohol use: No    Alcohol/week: 0.0 standard drinks  . Drug use: No  . Sexual activity: Not on file  Other Topics Concern  . Not on file  Social History Narrative  . Not on file   Social Determinants of Health   Financial Resource  Strain:   . Difficulty of Paying Living Expenses: Not on file  Food Insecurity:   . Worried About Charity fundraiser in the Last Year: Not on file  . Ran Out of Food in the Last Year: Not on file  Transportation Needs:   . Lack of Transportation (Medical): Not on file  . Lack of Transportation (Non-Medical): Not on file  Physical Activity:   . Days of Exercise per Week: Not on file  . Minutes of Exercise per Session: Not on file  Stress:   . Feeling of Stress : Not on file  Social Connections:   . Frequency of Communication with Friends and Family: Not on file  . Frequency of Social Gatherings with Friends and Family: Not on file  . Attends Religious Services: Not on file  . Active Member of Clubs or Organizations: Not on file  . Attends Archivist Meetings: Not on file  . Marital Status: Not on file  Intimate Partner Violence:   . Fear of Current or Ex-Partner: Not on file  . Emotionally Abused: Not on file  . Physically Abused: Not on file  . Sexually Abused: Not on file  Constitutional: Denies fever, malaise, fatigue, headache or abrupt weight changes.  HEENT: Denies eye pain, eye redness, ear pain, ringing in the ears, wax buildup, runny nose, nasal congestion, bloody nose, or sore throat. Respiratory: Denies difficulty breathing, shortness of breath, cough or sputum production.   Cardiovascular: Denies chest pain, chest tightness, palpitations or swelling in the hands or feet.  Gastrointestinal: Denies abdominal pain, bloating, constipation, diarrhea or blood in the stool.  GU: Denies urgency, frequency, pain with urination, burning sensation, blood in urine, odor or discharge. Musculoskeletal: Denies decrease in range of motion, difficulty with gait, muscle pain or joint pain and swelling.  Skin: Denies redness, rashes, lesions or ulcercations.  Neurological: Denies dizziness, difficulty with memory, difficulty with speech or problems with balance and  coordination.  Psych: Denies anxiety, depression, SI/HI.  No other specific complaints in a complete review of systems (except as listed in HPI above).  Objective:   Physical Exam BP 124/80   Pulse 69   Temp (!) 97.3 F (36.3 C) (Temporal)   Ht 5\' 9"  (1.753 m)   Wt 222 lb (100.7 kg)   SpO2 98%   BMI 32.78 kg/m   Wt Readings from Last 3 Encounters:  05/20/18 208 lb 12.8 oz (94.7 kg)  05/17/17 210 lb (95.3 kg)  05/15/16 200 lb 8 oz (90.9 kg)    General: Appears his stated age, obese, in NAD. Skin: Warm, dry and intact. Lipoma noted of left forearm. HEENT: Head: normal shape and size; Eyes: sclera white, no icterus, conjunctiva pink, PERRLA and EOMs intact;  Neck:  Neck supple, trachea midline. No masses, lumps. Thyromegaly noted. Cardiovascular: Normal rate and rhythm. S1,S2 noted.  No murmur, rubs or gallops noted. No JVD or BLE edema. No carotid bruits noted. Pulmonary/Chest: Normal effort and positive vesicular breath sounds. No respiratory distress. No wheezes, rales or ronchi noted.  Abdomen: Soft and nontender. Normal bowel sounds. Ventral hernia noted. Liver, spleen and kidneys non palpable. Musculoskeletal: Strength 5/5 BUE/BLE. No difficulty with gait.  Neurological: Alert and oriented. Cranial nerves [name]-XII grossly intact. Coordination normal.  Psychiatric: Mood and affect normal. Behavior is normal. Judgment and thought content normal.     BMET    Component Value Date/Time   NA 140 05/20/2018 1536   K 4.5 05/20/2018 1536   CL 106 05/20/2018 1536   CO2 24 05/20/2018 1536   GLUCOSE 98 05/20/2018 1536   BUN 23 05/20/2018 1536   CREATININE 0.95 05/20/2018 1536   CALCIUM 9.6 05/20/2018 1536   GFRNONAA 58 (L) 05/23/2014 1344   GFRAA 67 (L) 05/23/2014 1344    Lipid Panel     Component Value Date/Time   CHOL 244 (H) 05/20/2018 1536   TRIG (H) 05/20/2018 1536    482.0 Triglyceride is over 400; calculations on Lipids are invalid.   HDL 35.10 (L) 05/20/2018 1536    CHOLHDL 7 05/20/2018 1536   VLDL 48.8 (H) 08/22/2017 1450   LDLCALC 145 (H) 05/15/2016 1501    CBC    Component Value Date/Time   WBC 9.1 05/20/2018 1536   RBC 5.44 05/20/2018 1536   HGB 16.1 05/20/2018 1536   HCT 47.4 05/20/2018 1536   PLT 245.0 05/20/2018 1536   MCV 87.1 05/20/2018 1536   MCH 30.1 05/23/2014 1344   MCHC 34.1 05/20/2018 1536   RDW 14.8 05/20/2018 1536   LYMPHSABS 3.0 12/25/2014 1011   MONOABS 1.0 12/25/2014 1011   EOSABS 0.2 12/25/2014 1011   BASOSABS 0.0 12/25/2014 1011  Hgb A1C Lab Results  Component Value Date   HGBA1C 5.7 05/15/2016          Assessment & Plan:   Preventative Health Maintenance:  Encouraged him to get a flu shot in the fall Tetanus UTD Cologuard ordered Encouraged him to consume a balanced diet and exercise regimen Advised him to see an eye doctor and dentist annually Will check CBC, CMET, Lipid and PSA today  RTC in 1 year, sooner if needed Nicki Reaper, NP This visit occurred during the SARS-CoV-2 public health emergency.  Safety protocols were in place, including screening questions prior to the visit, additional usage of staff PPE, and extensive cleaning of exam room while observing appropriate contact time as indicated for disinfecting solutions.

## 2019-05-21 NOTE — Assessment & Plan Note (Signed)
CBC, CMET and Lipid profile today Encouraged him to consume a low fat diet

## 2019-05-22 LAB — COMPREHENSIVE METABOLIC PANEL
ALT: 20 U/L (ref 0–53)
AST: 13 U/L (ref 0–37)
Albumin: 4.3 g/dL (ref 3.5–5.2)
Alkaline Phosphatase: 51 U/L (ref 39–117)
BUN: 17 mg/dL (ref 6–23)
CO2: 25 mEq/L (ref 19–32)
Calcium: 9.5 mg/dL (ref 8.4–10.5)
Chloride: 106 mEq/L (ref 96–112)
Creatinine, Ser: 1.11 mg/dL (ref 0.40–1.50)
GFR: 67.7 mL/min (ref >60.00)
Glucose, Bld: 91 mg/dL (ref 70–99)
Potassium: 4.5 mEq/L (ref 3.5–5.1)
Sodium: 141 mEq/L (ref 135–145)
Total Bilirubin: 0.5 mg/dL (ref 0.2–1.2)
Total Protein: 7.2 g/dL (ref 6.0–8.3)

## 2019-05-22 LAB — PSA: PSA: 0.81 ng/mL (ref 0.10–4.00)

## 2019-05-22 LAB — CBC
HCT: 44.9 % (ref 39.0–52.0)
Hemoglobin: 15.2 g/dL (ref 13.0–17.0)
MCHC: 34 g/dL (ref 30.0–36.0)
MCV: 87.5 fl (ref 78.0–100.0)
Platelets: 244 10*3/uL (ref 150.0–400.0)
RBC: 5.13 Mil/uL (ref 4.22–5.81)
RDW: 13.9 % (ref 11.5–15.5)
WBC: 9.4 10*3/uL (ref 4.0–10.5)

## 2019-05-22 LAB — LIPID PANEL
Cholesterol: 229 mg/dL — ABNORMAL HIGH (ref 0–200)
HDL: 40.3 mg/dL (ref >39.00)
NonHDL: 188.32
Total CHOL/HDL Ratio: 6
Triglycerides: 260 mg/dL — ABNORMAL HIGH (ref 0.0–149.0)
VLDL: 52 mg/dL — ABNORMAL HIGH (ref 0.0–40.0)

## 2019-05-22 LAB — TSH: TSH: 1.27 u[IU]/mL (ref 0.35–4.50)

## 2019-05-22 LAB — LDL CHOLESTEROL, DIRECT: Direct LDL: 149 mg/dL

## 2019-09-26 ENCOUNTER — Encounter: Payer: Self-pay | Admitting: Internal Medicine

## 2019-10-20 LAB — HEMOGLOBIN A1C: HEMOGLOBIN A1C % (INT/EXT): 5.7 % — ABNORMAL HIGH (ref <5.7)

## 2019-10-20 LAB — LIPID PROFILE (EXT)
Cholesterol (EXT): 181 mg/dL (ref 0–199)
HDL Cholesterol (EXT): 53 mg/dL (ref >39)
LDL Cholesterol (EXT): 118 mg/dL (ref 50–129)
NON HDL Cholesterol (EXT): 128 mg/dL
Triglycerides (EXT): 51 mg/dL (ref 0–150)

## 2020-01-10 ENCOUNTER — Ambulatory Visit
Admission: EM | Admit: 2020-01-10 | Discharge: 2020-01-10 | Disposition: A | Discharge status: Home / Self Care | Payer: Managed Care, Other (non HMO) | Attending: Emergency Medicine | Admitting: Emergency Medicine

## 2020-01-10 ENCOUNTER — Other Ambulatory Visit: Payer: Self-pay

## 2020-01-10 DIAGNOSIS — R21 Rash and other nonspecific skin eruption: Secondary | ICD-10-CM

## 2020-01-10 DIAGNOSIS — B029 Zoster without complications: Secondary | ICD-10-CM

## 2020-01-10 MED ORDER — PREDNISONE 10 MG (21) PO TBPK: ORAL_TABLET | Freq: Every day | ORAL | 0 refills | Status: DC

## 2020-01-10 MED ORDER — VALACYCLOVIR HCL 1 G PO TABS: 1000.0000 mg | ORAL_TABLET | Freq: Three times a day (TID) | ORAL | 0 refills | Status: AC

## 2020-01-10 NOTE — ED Provider Notes (Signed)
[facility]   [id number] 01/10/20 Arrival Time: 1122  CC: Rash  SUBJECTIVE:  Richard [name] is a 59 y.o. male who presents with rash to RT side of chest x 2 days. Localizes the rash to RT chest.  Describes it as painful and red.  Denies alleviating factors. Symptoms are made worse to the touch.  Denies similar symptoms in the past.   Denies fever, chills, nausea, vomiting, swelling, discharge, SOB, chest pain, abdominal pain, changes in bowel or bladder function.    ROS: As per HPI.  All other pertinent ROS negative.     History reviewed. No pertinent past medical history. Past Surgical History:  Procedure Laterality Date  . HERNIA REPAIR  2002   Double   No Known Allergies No current facility-administered medications on file prior to encounter.   Current Outpatient Medications on File Prior to Encounter  Medication Sig Dispense Refill  . Multiple Vitamin (MULTIVITAMIN) tablet Take 1 tablet by mouth daily.    Marland Kitchen omega-3 fish oil (MAXEPA) 1000 MG CAPS capsule Take 1 capsule by mouth daily.     Social History   Socioeconomic History  . Marital status: Widowed    Spouse name: Not on file  . Number of children: Not on file  . Years of education: Not on file  . Highest education level: Not on file  Occupational History  . Not on file  Tobacco Use  . Smoking status: Former Smoker    Packs/day: 1.00    Years: 35.00    Pack years: 35.00    Types: Cigarettes  . Smokeless tobacco: Never Used  . Tobacco comment: 02/2019  Substance and Sexual Activity  . Alcohol use: No    Alcohol/week: 0.0 standard drinks  . Drug use: No  . Sexual activity: Not on file  Other Topics Concern  . Not on file  Social History Narrative  . Not on file   Social Determinants of Health   Financial Resource Strain:   . Difficulty of Paying Living Expenses: Not on file  Food Insecurity:   . Worried About Programme researcher, broadcasting/film/video in the Last Year: Not on file  . Ran Out of Food in the  Last Year: Not on file  Transportation Needs:   . Lack of Transportation (Medical): Not on file  . Lack of Transportation (Non-Medical): Not on file  Physical Activity:   . Days of Exercise per Week: Not on file  . Minutes of Exercise per Session: Not on file  Stress:   . Feeling of Stress : Not on file  Social Connections:   . Frequency of Communication with Friends and Family: Not on file  . Frequency of Social Gatherings with Friends and Family: Not on file  . Attends Religious Services: Not on file  . Active Member of Clubs or Organizations: Not on file  . Attends Banker Meetings: Not on file  . Marital Status: Not on file  Intimate Partner Violence:   . Fear of Current or Ex-Partner: Not on file  . Emotionally Abused: Not on file  . Physically Abused: Not on file  . Sexually Abused: Not on file   Family History  Problem Relation Age of Onset  . Cancer Mother        liver  . Cancer Maternal Uncle        bone  . Diabetes Paternal Grandmother   . Early death Neg Hx   . Hypertension Neg Hx   .  Stroke Neg Hx     OBJECTIVE: Vitals:   01/10/20 1146  BP: (!) 143/84  Pulse: 99  Resp: 18  Temp: 98.8 F (37.1 C)  SpO2: 97%    General appearance: alert; no distress Head: NCAT Lungs: normal respiratory effort Extremities: no edema Skin: warm and dry; Crops of erythematous papules and vesicles over RT side of chest dermatome on the T5; some crusting Psychological: alert and cooperative; normal mood and affect  ASSESSMENT & PLAN:  1. Herpes zoster without complication   2. Rash and nonspecific skin eruption     Meds ordered this encounter  Medications  . predniSONE (STERAPRED UNI-PAK 21 TAB) 10 MG (21) TBPK tablet    Sig: Take by mouth daily. Take 6 tabs by mouth daily  for 2 days, then 5 tabs for 2 days, then 4 tabs for 2 days, then 3 tabs for 2 days, 2 tabs for 2 days, then 1 tab by mouth daily for 2 days    Dispense:  42 tablet    Refill:  0     Order Specific Question:   Supervising Provider    Answer:   Eustace Moore [[id number]]  . valACYclovir (VALTREX) 1000 MG tablet    Sig: Take 1 tablet (1,000 mg total) by mouth 3 (three) times daily for 10 days.    Dispense:  30 tablet    Refill:  0    Order Specific Question:   Supervising Provider    Answer:   Eustace Moore [[id number]]    Rest and use ice/heat as needed for symptomatic relief Prescribed valacyclovir 1000mg  3x/day for 10 days Prescribed prednisone taper for inflammation and pain Use OTC medications such as ibuprofen/ tylenol.   Follow up with PCP in 7-10 days if rash is still present Follow up with PCP if symptoms of burning, stinging, tingling or numbness occur after rash resolves, you may need additional treatment Return here or go to ER if you have any new or worsening symptoms (such as eye involvement, severe pain, or signs of secondary infection such as fever, chills, nausea, vomiting, discharge, redness or warmth over site of rash)   Reviewed expectations re: course of current medical issues. Questions answered. Outlined signs and symptoms indicating need for more acute intervention. Patient verbalized understanding. After Visit Summary given.   9-10, PA-C 01/10/20 1222

## 2020-01-10 NOTE — ED Triage Notes (Signed)
Pt presents with rash on right torso that began a couple days ago, believes to be shingles

## 2020-01-10 NOTE — Discharge Instructions (Signed)
Rest and use ice/heat as needed for symptomatic relief Prescribed valacyclovir 1000mg 3x/day for 10 days Prescribed prednisone taper for inflammation and pain Use OTC medications such as ibuprofen/ tylenol.  Follow up with PCP in 7-10 days if rash is still present Follow up with PCP if symptoms of burning, stinging, tingling or numbness occur after rash resolves, you may need additional treatment Return here or go to ER if you have any new or worsening symptoms (such as eye involvement, severe pain, or signs of secondary infection such as fever, chills, nausea, vomiting, discharge, redness or warmth over site of rash) 

## 2020-01-19 ENCOUNTER — Other Ambulatory Visit: Payer: Self-pay

## 2020-01-19 ENCOUNTER — Ambulatory Visit
Admission: EM | Admit: 2020-01-19 | Discharge: 2020-01-19 | Disposition: A | Discharge status: Home / Self Care | Payer: Managed Care, Other (non HMO)

## 2020-01-19 ENCOUNTER — Encounter: Payer: Self-pay | Admitting: Emergency Medicine

## 2020-01-19 NOTE — ED Triage Notes (Signed)
Pt here for follow up visit for shingles. States his rash is much better.  Needs note to return to work.

## 2020-01-29 ENCOUNTER — Encounter: Payer: Self-pay | Admitting: Internal Medicine

## 2020-05-25 ENCOUNTER — Encounter: Payer: Self-pay | Admitting: Internal Medicine

## 2020-05-25 ENCOUNTER — Other Ambulatory Visit: Payer: Self-pay

## 2020-05-25 ENCOUNTER — Ambulatory Visit (INDEPENDENT_AMBULATORY_CARE_PROVIDER_SITE_OTHER): Payer: Managed Care, Other (non HMO) | Admitting: Internal Medicine

## 2020-05-25 VITALS — BP 128/82 | HR 72 | Temp 98.0°F | Ht 68.75 in | Wt 228.0 lb

## 2020-05-25 DIAGNOSIS — Z125 Encounter for screening for malignant neoplasm of prostate: Secondary | ICD-10-CM | POA: Diagnosis not present

## 2020-05-25 DIAGNOSIS — Z23 Encounter for immunization: Secondary | ICD-10-CM | POA: Diagnosis not present

## 2020-05-25 DIAGNOSIS — Z0001 Encounter for general adult medical examination with abnormal findings: Secondary | ICD-10-CM

## 2020-05-25 DIAGNOSIS — E7800 Pure hypercholesterolemia, unspecified: Secondary | ICD-10-CM

## 2020-05-25 NOTE — Assessment & Plan Note (Signed)
CMET and Lipid profile today Encouraged him to consume a low fat diet Continue Fish Oil

## 2020-05-25 NOTE — Patient Instructions (Signed)

## 2020-05-25 NOTE — Progress Notes (Signed)
Subjective:    Patient ID: Richard [name], male    DOB: [birth year], 60 y.o.   MRN: [id number]  HPI  Pt presents to the clinic today for his annual exam.  HLD: His last LDL was 149, 05/2019. He is taking Fish Oil OTC. He tries to consume a low fat diet.  Flu: never Tetanus: 04/2013 Covid: never Shingrix: never PSA Screening: 05/2019 Colon Screening: 05/2016, Cologuard Vision Screening: annually Dentist: biannually  Diet: He does eat some meat. He consumes fruits and veggies. He tries to avoid fried foods. He drinks mostly water, sweet tea. Exercise: None  Review of Systems      No past medical history on file.  Current Outpatient Medications  Medication Sig Dispense Refill  . Multiple Vitamin (MULTIVITAMIN) tablet Take 1 tablet by mouth daily.    Marland Kitchen omega-3 fish oil (MAXEPA) 1000 MG CAPS capsule Take 1 capsule by mouth daily.    . predniSONE (STERAPRED UNI-PAK 21 TAB) 10 MG (21) TBPK tablet Take by mouth daily. Take 6 tabs by mouth daily  for 2 days, then 5 tabs for 2 days, then 4 tabs for 2 days, then 3 tabs for 2 days, 2 tabs for 2 days, then 1 tab by mouth daily for 2 days 42 tablet 0   No current facility-administered medications for this visit.    No Known Allergies  Family History  Problem Relation Age of Onset  . Cancer Mother        liver  . Cancer Maternal Uncle        bone  . Diabetes Paternal Grandmother   . Early death Neg Hx   . Hypertension Neg Hx   . Stroke Neg Hx     Social History   Socioeconomic History  . Marital status: Widowed    Spouse name: Not on file  . Number of children: Not on file  . Years of education: Not on file  . Highest education level: Not on file  Occupational History  . Not on file  Tobacco Use  . Smoking status: Former Smoker    Packs/day: 1.00    Years: 35.00    Pack years: 35.00    Types: Cigarettes  . Smokeless tobacco: Never Used  . Tobacco comment: 02/2019  Substance and Sexual Activity  . Alcohol use: No     Alcohol/week: 0.0 standard drinks  . Drug use: No  . Sexual activity: Not on file  Other Topics Concern  . Not on file  Social History Narrative  . Not on file   Social Determinants of Health   Financial Resource Strain: Not on file  Food Insecurity: Not on file  Transportation Needs: Not on file  Physical Activity: Not on file  Stress: Not on file  Social Connections: Not on file  Intimate Partner Violence: Not on file     Constitutional: Denies fever, malaise, fatigue, headache or abrupt weight changes.  HEENT: Denies eye pain, eye redness, ear pain, ringing in the ears, wax buildup, runny nose, nasal congestion, bloody nose, or sore throat. Respiratory: Denies difficulty breathing, shortness of breath, cough or sputum production.   Cardiovascular: Denies chest pain, chest tightness, palpitations or swelling in the hands or feet.  Gastrointestinal: Denies abdominal pain, bloating, constipation, diarrhea or blood in the stool.  GU: Pt reports intermittent issues with ED. Denies urgency, frequency, pain with urination, burning sensation, blood in urine, odor or discharge. Musculoskeletal: Denies decrease in range of motion, difficulty with gait, muscle  pain or joint pain and swelling.  Skin: Denies redness, rashes, lesions or ulcercations.  Neurological: Denies dizziness, difficulty with memory, difficulty with speech or problems with balance and coordination.  Psych: Denies anxiety, depression, SI/HI.  No other specific complaints in a complete review of systems (except as listed in HPI above).  Objective:   Physical Exam  BP 128/82   Pulse 72   Temp 98 F (36.7 C) (Temporal)   Ht 5' 8.75" (1.746 m)   Wt 228 lb (103.4 kg)   SpO2 98%   BMI 33.92 kg/m   Wt Readings from Last 3 Encounters:  01/19/20 230 lb (104.3 kg)  05/21/19 222 lb (100.7 kg)  05/20/18 208 lb 12.8 oz (94.7 kg)    General: Appears his stated age, obese in NAD. Skin: Warm, dry and intact. No  rashes noted. HEENT: Head: normal shape and size; Eyes: sclera white, no icterus, conjunctiva pink, PERRLA and EOMs intact;  Neck:  Neck supple, trachea midline. No masses, lumps present. Thyromegaly noted. Cardiovascular: Normal rate and rhythm. S1,S2 noted.  No murmur, rubs or gallops noted. No JVD or BLE edema. No carotid bruits noted. Pulmonary/Chest: Normal effort and positive vesicular breath sounds. No respiratory distress. No wheezes, rales or ronchi noted.  Abdomen: Soft and nontender. Normal bowel sounds. Ventral hernia noted. Liver, spleen and kidneys non palpable. Musculoskeletal: Strength 5/5 BUE/BLE. No difficulty with gait.  Neurological: Alert and oriented. Cranial nerves [name]-XII grossly intact. Coordination normal.  Psychiatric: Mood and affect normal. Behavior is normal. Judgment and thought content normal.    BMET    Component Value Date/Time   NA 141 05/21/2019 1528   K 4.5 05/21/2019 1528   CL 106 05/21/2019 1528   CO2 25 05/21/2019 1528   GLUCOSE 91 05/21/2019 1528   BUN 17 05/21/2019 1528   CREATININE 1.11 05/21/2019 1528   CALCIUM 9.5 05/21/2019 1528   GFRNONAA 58 (L) 05/23/2014 1344   GFRAA 67 (L) 05/23/2014 1344    Lipid Panel     Component Value Date/Time   CHOL 229 (H) 05/21/2019 1528   TRIG 260.0 (H) 05/21/2019 1528   HDL 40.30 05/21/2019 1528   CHOLHDL 6 05/21/2019 1528   VLDL 52.0 (H) 05/21/2019 1528   LDLCALC 145 (H) 05/15/2016 1501    CBC    Component Value Date/Time   WBC 9.4 05/21/2019 1528   RBC 5.13 05/21/2019 1528   HGB 15.2 05/21/2019 1528   HCT 44.9 05/21/2019 1528   PLT 244.0 05/21/2019 1528   MCV 87.5 05/21/2019 1528   MCH 30.1 05/23/2014 1344   MCHC 34.0 05/21/2019 1528   RDW 13.9 05/21/2019 1528   LYMPHSABS 3.0 12/25/2014 1011   MONOABS 1.0 12/25/2014 1011   EOSABS 0.2 12/25/2014 1011   BASOSABS 0.0 12/25/2014 1011    Hgb A1C Lab Results  Component Value Date   HGBA1C 5.7 05/15/2016           Assessment &  Plan:   Preventative Health Maintenance:  He declines flu shot Tetanus UTD He declines covid vaccine Shingrix vaccine today He declines colon cancer screening Encouraged him to consume a balanced diet and exercise regimen Advised him to see an eye doctor and dentist annually Will check CBC, CMET, Lipid,TSH, A1C and PSA today  RTC in 1 year, sooner if needed Nicki Reaper, NP This visit occurred during the SARS-CoV-2 public health emergency.  Safety protocols were in place, including screening questions prior to the visit, additional usage of staff PPE, and  extensive cleaning of exam room while observing appropriate contact time as indicated for disinfecting solutions.

## 2020-05-26 LAB — TSH: TSH: 1.28 u[IU]/mL (ref 0.35–4.50)

## 2020-05-26 LAB — COMPREHENSIVE METABOLIC PANEL
ALT: 28 U/L (ref 0–53)
AST: 20 U/L (ref 0–37)
Albumin: 4.5 g/dL (ref 3.5–5.2)
Alkaline Phosphatase: 55 U/L (ref 39–117)
BUN: 17 mg/dL (ref 6–23)
CO2: 28 mEq/L (ref 19–32)
Calcium: 9.5 mg/dL (ref 8.4–10.5)
Chloride: 102 mEq/L (ref 96–112)
Creatinine, Ser: 1.07 mg/dL (ref 0.40–1.50)
GFR: 75.45 mL/min (ref >60.00)
Glucose, Bld: 95 mg/dL (ref 70–99)
Potassium: 4.1 mEq/L (ref 3.5–5.1)
Sodium: 138 mEq/L (ref 135–145)
Total Bilirubin: 0.8 mg/dL (ref 0.2–1.2)
Total Protein: 7.5 g/dL (ref 6.0–8.3)

## 2020-05-26 LAB — PSA: PSA: 1.13 ng/mL (ref 0.10–4.00)

## 2020-05-26 LAB — LIPID PANEL
Cholesterol: 239 mg/dL — ABNORMAL HIGH (ref 0–200)
HDL: 42.9 mg/dL (ref >39.00)
NonHDL: 196.31
Total CHOL/HDL Ratio: 6
Triglycerides: 276 mg/dL — ABNORMAL HIGH (ref 0.0–149.0)
VLDL: 55.2 mg/dL — ABNORMAL HIGH (ref 0.0–40.0)

## 2020-05-26 LAB — CBC
HCT: 46 % (ref 39.0–52.0)
Hemoglobin: 15.6 g/dL (ref 13.0–17.0)
MCHC: 33.9 g/dL (ref 30.0–36.0)
MCV: 85.3 fl (ref 78.0–100.0)
Platelets: 244 10*3/uL (ref 150.0–400.0)
RBC: 5.4 Mil/uL (ref 4.22–5.81)
RDW: 14.3 % (ref 11.5–15.5)
WBC: 9.1 10*3/uL (ref 4.0–10.5)

## 2020-05-26 LAB — LDL CHOLESTEROL, DIRECT: Direct LDL: 159 mg/dL

## 2020-05-26 LAB — HEMOGLOBIN A1C: Hgb A1c MFr Bld: 5.9 % (ref 4.6–6.5)

## 2020-05-27 NOTE — Addendum Note (Signed)
Addended by: Roena Malady on: 05/27/2020 09:19 AM   Modules accepted: Orders

## 2020-06-01 ENCOUNTER — Encounter: Payer: Self-pay | Admitting: Internal Medicine

## 2020-06-21 MED ORDER — ATORVASTATIN CALCIUM 10 MG PO TABS: 10.0000 mg | ORAL_TABLET | Freq: Every day | ORAL | 2 refills | Status: DC

## 2020-06-21 NOTE — Addendum Note (Signed)
Addended by: Roena Malady on: 06/21/2020 06:28 PM   Modules accepted: Orders

## 2020-07-27 ENCOUNTER — Encounter: Payer: Self-pay | Admitting: Internal Medicine

## 2020-08-05 ENCOUNTER — Other Ambulatory Visit: Payer: Self-pay

## 2020-08-05 ENCOUNTER — Ambulatory Visit (INDEPENDENT_AMBULATORY_CARE_PROVIDER_SITE_OTHER): Payer: Self-pay

## 2020-08-05 DIAGNOSIS — Z23 Encounter for immunization: Secondary | ICD-10-CM

## 2020-09-01 ENCOUNTER — Ambulatory Visit: Payer: Managed Care, Other (non HMO)

## 2020-09-14 LAB — LIPID PROFILE (EXT)
Cholesterol (EXT): 181 mg/dL (ref 0–199)
HDL Cholesterol (EXT): 56 mg/dL (ref >39)
LDL Cholesterol (EXT): 114 mg/dL (ref 50–129)
NON HDL Cholesterol (EXT): 125 mg/dL
Triglycerides (EXT): 55 mg/dL (ref 0–150)

## 2020-09-14 LAB — PSA DIAGNOSTIC (EXT): PSA (EXT): 1.54 ng/mL (ref 0.00–4.00)

## 2020-09-14 LAB — HEMOGLOBIN A1C: HEMOGLOBIN A1C % (INT/EXT): 5.7 % — ABNORMAL HIGH (ref <5.7)

## 2021-05-13 ENCOUNTER — Encounter: Payer: Self-pay | Admitting: Internal Medicine

## 2021-08-03 ENCOUNTER — Ambulatory Visit: Admit: 2021-08-03 | Payer: PRIVATE HEALTH INSURANCE | Primary: Internal Medicine

## 2021-08-03 ENCOUNTER — Other Ambulatory Visit: Admit: 2021-08-03 | Payer: PRIVATE HEALTH INSURANCE | Primary: Internal Medicine

## 2021-08-03 ENCOUNTER — Ambulatory Visit
Admit: 2021-08-03 | Discharge: 2021-08-03 | Payer: PRIVATE HEALTH INSURANCE | Attending: Specialist | Primary: Internal Medicine

## 2021-08-03 DIAGNOSIS — H269 Unspecified cataract: Secondary | ICD-10-CM

## 2021-08-03 DIAGNOSIS — B0052 Herpesviral keratitis: Secondary | ICD-10-CM

## 2021-08-03 DIAGNOSIS — H169 Unspecified keratitis: Secondary | ICD-10-CM

## 2021-08-03 DIAGNOSIS — H18523 Epithelial (juvenile) corneal dystrophy, bilateral: Secondary | ICD-10-CM

## 2021-08-03 LAB — VARICELLA ZOSTER ANTIBODY, IGG: Varicella Zoster IgG: IMMUNE

## 2021-08-03 LAB — HSV 2 ANTIBODY, IGG: HSV 2 IgG: POSITIVE — AB

## 2021-08-03 LAB — HSV 1 ANTIBODY, IGG: HSV 1 IgG: POSITIVE — AB

## 2021-08-03 NOTE — Addendum Note (Signed)
Addended by: Max Fickle on: 08/03/2021 12:11 PM     Modules accepted: Orders

## 2021-08-03 NOTE — Progress Notes (Signed)
Self-referred for 2nd opinion of management of post-op CE/IOL OS and consideration of eventual CE/IOL OD. Sister is patient of Dr. Haskell RilingWu's. 1st visit with Dr. Dorna BloomWu 08/03/21.     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 Elnoria HowardHung at [facility] on 06/30/21  - Patient reports vision OS initially quite good. However within ~10 days post-operatively had gradually decreased vision which slowly progressed. Post-operative history until today's visit as below:     - 07/01/21 [facility] VA OS 20/20, normal post-operative changes  - 07/07/21 called to report new onset floaters OS without flashes or curtain/shadow symptoms. [facility]  - 07/11/21 [facility] POD10 VA 20/20-2, noted to have new PEEs, reassured, recommended to buy OTC ATs  - 07/20/21 called on-call fellow to report blurriness and haziness since last post-op visit. Denied pain, redness, light sensitivity. Recommended to start PFATs QID.  - 07/23/21 [facility] POW3 VA 20/50+2 ph 20/30, noted to have worsened PEEs, recommended to finish PF taper, stop ketorolac, start PFATs q2h OS, start emycin ung  - 07/25/21 [facility] VA 20/80 noted to have severe dry eye OS, recommended to d/c pred, continue PFATs, start Refresh PM BID, d/c erythromycin ointment  - 07/26/21 [facility] VA CF at 3', noted to have central epi defect without infiltrate, no cell, BCL placed, start ofloxacin TID OS, PFATs PRN, d/c AT ung  - 07/27/21 [facility] VA CF at 3', noted to have corneal edema, central epi defect, diffuse staining with irregular epithelium, no cell. B scan OS without pseudophacodonesis, choroidal effusion, vitritis. Noted to have Slight thickening and hazy appearance of retinochoroidal layer; engorged vortex veins are noted in the inferonasal equatorial region. Diagnosed with postop corneal edema/melt OS. Suspected due to postop ketorolac use. BCL placed, started on moxifloxacin QID, muro QID.  - 07/29/21 [facility] VA CF at 3', irregular  epithelium with 1+ edema, 1+ folds, trace cell/flare. BCL was d/c'ed. Continued on ofloxacin TID OS. Started on pred BID OS. Muro increased to q2h.   - 08/02/21 [facility] cornea VA 20/60-2, intact corneal sensation, noted to have paracentral dendrite between 5-9 oc approx 4.4 mm in length with few terminal bulbs, no epi defect, no edema, trace cell/flare. Recommended by Dr. Smith Robertao to start Valtrex 500 mg TID, erythryomycin ung 1-2x daily, stop pred, stop ofloxacin, stop muro, use artificial tear gel drops PRN  - Today 08/03/21: Patient reports gradual improvement in vision. Reports mild pain and more severe photosensitivity peaked last week. Denies current pain or photophobia. Currently using Muro 128 QID OS, Prednisolone BID OS, ofloxacin TID OS. 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 Smith Robertao yesterday so has not started recommended treatments.   - VA OS 20/200 ph 20/70. IOP wnl.  - Exam OS notable for tr central D-folds, 4.5 mm linear epithelial staining paracentrally 5 oc to 9 oc; diffuse PEE. Appearance of epithelial staining could be suspicious for HSV.   Exam also notable for ABMD, which could contribute to lack of reepithelialization    ECC 08/03/21  OD: CD 2146, CV 32, HEX 51  OS: CD 1739, CV 28, HEX 58    Topography 08/03/21  OD: 41.82 and 41.58 @ 172. Astigmatism 0.24 D.  OS: 38.98 and 37.65 @ 96. Astigmatism 1.33 D.    Pentacam 08/03/21:  OD: 0.2 D astigmatism  OS: 0.1 D astigmatism. Inferior steepening.     Corneal sensation: normal OD;  OS possible decrease    Imp/Plan:   Agree with Valtrex 500mg  TID PO  Restart emycin ung   Continue Muro 28 QID   Continue PF BID (consider taper at next visit)  Continue Ofloxacin TID   Continue Refresh PM nightly  Send for HSV 1&2 serology today   Consider confocal for nerves in the future     Cataract OD  Visually significant. BCVA 20/20. Patient endorses glare and halos. Interested in pursuing cataract surgery OD once OS has stabilized.     F/U 1 week  MRX/IOP    All diagnoses were discussed in detail with the patient including the details of today's exam and testing, treatment options, prognosis, and need for follow up as directed. Importance of follow up and following prescribed medicines and directions we discussed. Risk of medications were reviewed as well as the importance to follow up to prevent vision loss. All questions were answered and patient expressed understanding. Patient to call with any new concerns.

## 2021-08-06 LAB — CMV IGM: Cytomegalovirus Ab(IgM): <30 [AU]/ml

## 2021-08-10 ENCOUNTER — Ambulatory Visit
Admit: 2021-08-10 | Discharge: 2021-08-10 | Payer: PRIVATE HEALTH INSURANCE | Attending: Specialist | Primary: Internal Medicine

## 2021-08-10 DIAGNOSIS — H169 Unspecified keratitis: Secondary | ICD-10-CM

## 2021-08-10 DIAGNOSIS — B0052 Herpesviral keratitis: Secondary | ICD-10-CM

## 2021-08-10 NOTE — Progress Notes (Signed)
Self-referred for 2nd opinion of management of post-op CE/IOL OS and consideration of eventual CE/IOL OD. Sister is patient of Dr. Haskell RilingWu's. 1st visit with Dr. Dorna BloomWu 08/03/21.     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 Elnoria HowardHung at [facility] on 06/30/21  - Patient reports vision OS initially quite good. However within ~10 days post-operatively had gradually decreased vision which slowly progressed. Post-operative history until today's visit as below:     - 07/01/21 [facility] VA OS 20/20, normal post-operative changes  - 07/07/21 called to report new onset floaters OS without flashes or curtain/shadow symptoms. [facility]  - 07/11/21 [facility] POD10 VA 20/20-2, noted to have new PEEs, reassured, recommended to buy OTC ATs  - 07/20/21 called on-call fellow to report blurriness and haziness since last post-op visit. Denied pain, redness, light sensitivity. Recommended to start PFATs QID.  - 07/23/21 [facility] POW3 VA 20/50+2 ph 20/30, noted to have worsened PEEs, recommended to finish PF taper, stop ketorolac, start PFATs q2h OS, start emycin ung  - 07/25/21 [facility] VA 20/80 noted to have severe dry eye OS, recommended to d/c pred, continue PFATs, start Refresh PM BID, d/c erythromycin ointment  - 07/26/21 [facility] VA CF at 3', noted to have central epi defect without infiltrate, no cell, BCL placed, start ofloxacin TID OS, PFATs PRN, d/c AT ung  - 07/27/21 [facility] VA CF at 3', noted to have corneal edema, central epi defect, diffuse staining with irregular epithelium, no cell. B scan OS without pseudophacodonesis, choroidal effusion, vitritis. Noted to have Slight thickening and hazy appearance of retinochoroidal layer; engorged vortex veins are noted in the inferonasal equatorial region. Diagnosed with postop corneal edema/melt OS. Suspected due to postop ketorolac use. BCL placed, started on moxifloxacin QID, muro QID.  - 07/29/21 [facility] VA CF at 3', irregular  epithelium with 1+ edema, 1+ folds, trace cell/flare. BCL was d/c'ed. Continued on ofloxacin TID OS. Started on pred BID OS. Muro increased to q2h.   - 08/02/21 [facility] cornea VA 20/60-2, intact corneal sensation, noted to have paracentral dendrite between 5-9 oc approx 4.4 mm in length with few terminal bulbs, no epi defect, no edema, trace cell/flare. Recommended by Dr. Smith Robertao to start Valtrex 500 mg TID, erythryomycin ung 1-2x daily, stop pred, stop ofloxacin, stop muro, use artificial tear gel drops PRN  - Today 08/03/21: Patient reports gradual improvement in vision. Reports mild pain and more severe photosensitivity peaked last week. Denies current pain or photophobia. Currently using Muro 128 QID OS, Prednisolone BID OS, ofloxacin TID OS. 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 Smith Robertao yesterday so has not started recommended treatments.   - VA OS 20/200 ph 20/70. IOP wnl.  - Exam OS notable for tr central D-folds, 4.5 mm linear epithelial staining paracentrally 5 oc to 9 oc; diffuse PEE. Appearance of epithelial staining could be suspicious for HSV.   Exam also notable for ABMD, which could contribute to lack of reepithelialization    ECC 08/03/21  OD: CD 2146, CV 32, HEX 51  OS: CD 1739, CV 28, HEX 58    Topography 08/03/21  OD: 41.82 and 41.58 @ 172. Astigmatism 0.24 D.  OS: 38.98 and 37.65 @ 96. Astigmatism 1.33 D.    Pentacam 08/03/21:  OD: 0.2 D astigmatism  OS: 0.1 D astigmatism. Inferior steepening.     Corneal sensation: normal OD;  OS possible decrease    Imp/Plan:   Agree with Valtrex 500mg  TID PO  Restart emycin ung   Continue Muro 28 QID   Continue PF BID (consider taper at next visit)  Continue Ofloxacin TID   Continue Refresh PM nightly  Send for HSV 1&2 serology today   Consider confocal for nerves in the future     08/10/21  Started the Valtrex Friday. 2 weeks of Valtrex and then a recheck.  Possible nausea with Valtrex. Vision is still distorted.   Plan:  Continue Valtrex 500mg   TID PO  Continue Erythromycin QHS   Continue Muro 128 TID  Continue PF BID (consider taper at next visit)  Continue Ofloxacin TID     RV 2-3 weeks Pentacam     Cataract OD  Visually significant. BCVA 20/20. Patient endorses glare and halos. Interested in pursuing cataract surgery OD once OS has stabilized.     All diagnoses were discussed in detail with the patient including the details of today's exam and testing, treatment options, prognosis, and need for follow up as directed. Importance of follow up and following prescribed medicines and directions we discussed. Risk of medications were reviewed as well as the importance to follow up to prevent vision loss. All questions were answered and patient expressed understanding. Patient to call with any new concerns.

## 2021-08-19 NOTE — Telephone Encounter (Signed)
From: [name]  To: Dr. Lajuan Lines  Sent: 08/19/2021 3:06 PM EDT  Subject: Eye Drops...    Dear Dr. Dorna Bloom and/or Staff,    Since my next appointment is on Friday, 6/16(about 2.5 weeks since my prior appt on 5/31), I was wondering if I should now be tapering down my drop regimen. I am currently taking the following:  Ofloxacin - 1 drop 3 times/day  Prednisolone - 1 drop 2 times/day  Muro 128 - 1 drop 3 times/day  I am also still using Erythromycin Ointment at bedtime.   I am particularly concerned about Prednisolone since this is a steroid which I have been taking since my cataract surgery on April 20th. Your insight would be most appreciated.   Thanks, Brian Tucker

## 2021-08-26 ENCOUNTER — Ambulatory Visit
Admit: 2021-08-26 | Discharge: 2021-08-26 | Payer: PRIVATE HEALTH INSURANCE | Attending: Specialist | Primary: Internal Medicine

## 2021-08-26 ENCOUNTER — Ambulatory Visit: Admit: 2021-08-26 | Payer: PRIVATE HEALTH INSURANCE | Primary: Internal Medicine

## 2021-08-26 DIAGNOSIS — H18523 Epithelial (juvenile) corneal dystrophy, bilateral: Secondary | ICD-10-CM

## 2021-08-26 DIAGNOSIS — H169 Unspecified keratitis: Secondary | ICD-10-CM

## 2021-08-26 NOTE — Progress Notes (Signed)
Self-referred for 2nd opinion of management of post-op CE/IOL OS and consideration of eventual CE/IOL OD. Sister is patient of Brian Tucker's. 1st visit with Brian Tucker 08/03/21.     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 However within ~10 days post-operatively had gradually decreased vision which slowly progressed. Post-operative history until today's visit as below:     - 07/01/21 [facility] VA OS 20/20, normal post-operative changes  - 07/07/21 called to report new onset floaters OS without flashes or curtain/shadow symptoms. [facility]  - 07/11/21 [facility] POD10 VA 20/20-2, noted to have new PEEs, reassured, recommended to buy OTC ATs  - 07/20/21 called on-call fellow to report blurriness and haziness since last post-op visit. Denied pain, redness, light sensitivity. Recommended to start PFATs QID.  - 07/23/21 [facility] POW3 VA 20/50+2 ph 20/30, noted to have worsened PEEs, recommended to finish PF taper, stop ketorolac, start PFATs q2h OS, start emycin ung  - 07/25/21 [facility] VA 20/80 noted to have severe dry eye OS, recommended to d/c pred, continue PFATs, start Refresh PM BID, d/c erythromycin ointment  - 07/26/21 [facility] VA CF at 3', noted to have central epi defect without infiltrate, no cell, BCL placed, start ofloxacin TID OS, PFATs PRN, d/c AT ung  - 07/27/21 [facility] VA CF at 3', noted to have corneal edema, central epi defect, diffuse staining with irregular epithelium, no cell. B scan OS without pseudophacodonesis, choroidal effusion, vitritis. Noted to have Slight thickening and hazy appearance of retinochoroidal layer; engorged vortex veins are noted in the inferonasal equatorial region. Diagnosed with postop corneal edema/melt OS. Suspected due to postop ketorolac use. BCL placed, started on moxifloxacin QID, muro QID.  - 07/29/21 [facility] VA CF at 3', irregular  epithelium with 1+ edema, 1+ folds, trace cell/flare. BCL was d/c'ed. Continued on ofloxacin TID OS. Started on pred BID OS. Muro increased to q2h.   - 08/02/21 [facility] cornea VA 20/60-2, intact corneal sensation, noted to have paracentral dendrite between 5-9 oc approx 4.4 mm in length with few terminal bulbs, no epi defect, no edema, trace cell/flare. Recommended by Brian Tucker to start Valtrex 500 mg TID, erythryomycin ung 1-2x daily, stop pred, stop ofloxacin, stop muro, use artificial tear gel drops PRN  - Today 08/03/21: Patient reports gradual improvement in vision. Reports mild pain and more severe photosensitivity peaked last week. Denies current pain or photophobia. Currently using Muro 128 QID OS, Prednisolone BID OS, ofloxacin TID OS. 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   - VA OS 20/200 ph 20/70. IOP wnl.  - Exam OS notable for tr central D-folds, 4.5 mm linear epithelial staining paracentrally 5 oc to 9 oc; diffuse PEE. Appearance of epithelial staining could be suspicious for HSV.   Exam also notable for ABMD, which could contribute to lack of reepithelialization    ECC 08/03/21  OD: CD 2146, CV 32, HEX 51  OS: CD 1739, CV 28, HEX 58    Topography 08/03/21  OD: 41.82 and 41.58 @ 172. Astigmatism 0.24 D.  OS: 38.98 and 37.65 @ 96. Astigmatism 1.33 D.    Pentacam 08/03/21:  OD: 0.2 D astigmatism  OS: 0.1 D astigmatism. Inferior steepening.     Corneal sensation: normal OD;  OS possible decrease    Imp/Plan:   Agree with Valtrex 500mg  TID PO  Restart emycin ung   Continue Muro 28 QID   Continue PF BID (consider taper at next visit)  Continue Ofloxacin TID   Continue Refresh PM nightly  Send for HSV 1&2 serology today   Consider confocal for nerves in the future     08/10/21  Started the Valtrex Friday. 2 weeks of Valtrex and then a recheck.  Possible nausea with Valtrex. Vision is still distorted.   Plan:  Continue Valtrex 500mg   TID PO  Continue Erythromycin QHS   Continue Muro 128 TID  Continue PF BID (consider taper at next visit)  Continue Ofloxacin TID     RV 2-3 weeks Pentacam     08/26/21  Vision is cloudy intermittently ("fades out")  Corneal staining is resolved, except for ABMD  Suspect blurry intermittent vision is due to preservatives in drops    Plan:  Stopped valtrex due to nausea  Stop Erythromycin QHS   Continue Muro 128 drops OS TID-could decrease BID x 2-3 weeks then stop  Decrease PF once daily for two weeks then stop  Stop ofloxacin   RV 6 weeks  Cataract evaluation OD          Cataract OD  Visually significant. BCVA 20/20. Patient endorses glare and halos. Interested in pursuing cataract surgery OD once OS has stabilized.     All diagnoses were discussed in detail with the patient including the details of today's exam and testing, treatment options, prognosis, and need for follow up as directed. Importance of follow up and following prescribed medicines and directions we discussed. Risk of medications were reviewed as well as the importance to follow up to prevent vision loss. All questions were answered and patient expressed understanding. Patient to call with any new concerns.

## 2021-09-07 ENCOUNTER — Ambulatory Visit: Payer: PRIVATE HEALTH INSURANCE | Attending: Specialist | Primary: Internal Medicine

## 2021-10-07 ENCOUNTER — Ambulatory Visit: Admit: 2021-10-07 | Payer: PRIVATE HEALTH INSURANCE | Attending: Specialist | Primary: Internal Medicine

## 2021-10-07 ENCOUNTER — Ambulatory Visit: Admit: 2021-10-07 | Primary: Internal Medicine

## 2021-10-07 DIAGNOSIS — H2511 Age-related nuclear cataract, right eye: Secondary | ICD-10-CM

## 2021-10-07 DIAGNOSIS — H52213 Irregular astigmatism, bilateral: Secondary | ICD-10-CM

## 2021-10-07 NOTE — Progress Notes (Signed)
Self-referred for 2nd opinion of management of post-op CE/IOL OS and consideration of eventual CE/IOL OD. Sister is patient of Dr. Haskell Riling. 1st visit with Dr. Dorna Bloom 08/03/21.   S/p complex cataract with PC IOL OS 06/30/21 21.0 D LI61AO    1. 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 Importance of follow up and following prescribed medicines and directions we discussed. Risk of medications were reviewed as well as the importance to follow up to prevent vision loss. All questions were answered and patient expressed understanding. Patient to call with any new concerns.  Visually significant cataracts.  Discussed risks, benefits and alternatives with patient who expresses understanding. 99% chance of success, 03/998 chance of loss of vision due to complications including but not limited to: bleeding, infection, high pressure or glaucoma, retinal tears or detachment, edema of cornea and retina, neuropathic pain, loss of vision, loss of eye. Lens options were discussed, including monofocal IOL, multifocal IOL, and EDOF IOL. Refractive target was discussed.   Visual potential may be limited by mild irregular astigmatism  No heavy lifting or bending for 4 weeks after surgery.  No water in the eye for 1 week after surgery and no swimming for 3 weeks after surgery.  Patient understands and wishes to proceed.  Discussed IOL options, patient chooses monofocal IOL.    Plan:  *Primary surgeon  Phaco/PCIOL OD-schedule  Aim for plano. [id number]  Will need readers after surgeries, patient understands.  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 Irregular astigmatism of both eyes  Mild skewing of radial axes OD with ATR astigmatism  Not sufficient astigmatism to warrant toric IOL    - Corneal Topography - OU - Both Eyes - 92025; Future    4. Keratitis  Healed keratitis OS-initially thought to be herpetic      5. Pseudophakia, left eye  Stable    -----------------------------------  Previous notes  S/p CE/IOL OS by Dr. Elnoria Howard at [facility] on 06/30/21  - Patient reports vision OS initially quite good. However within ~10 days post-operatively had gradually decreased vision which slowly progressed. Post-operative history until today's visit as below:     - 07/01/21 OCB VA OS 20/20, normal post-operative changes  - 07/07/21 called to report new onset floaters OS without flashes or curtain/shadow symptoms. Reassured by on-call [facility] fellow.  - 07/11/21 OCB POD10 VA 20/20-2, noted to have new PEEs, reassured, recommended to buy OTC ATs  - 07/20/21 called on-call fellow to report blurriness and haziness since last post-op visit. Denied pain, redness, light sensitivity. Recommended to start PFATs QID.  - 07/23/21 OCB POW3 VA 20/50+2 ph 20/30, noted to have worsened PEEs, recommended to finish PF taper, stop ketorolac, start PFATs q2h OS, start emycin ung  - 07/25/21 OCB VA 20/80 noted to have severe dry eye OS, recommended to d/c pred, continue PFATs, start Refresh PM BID, d/c erythromycin ointment  - 07/26/21 OCB VA CF at 3', noted to have  central epi defect without infiltrate, no cell, BCL placed, start ofloxacin TID OS, PFATs PRN, d/c AT ung  - 07/27/21 MEEI VA CF at 3', noted to have corneal edema, central epi defect, diffuse staining with irregular epithelium, no cell. B scan OS without pseudophacodonesis, choroidal effusion, vitritis. Noted to have Slight thickening and hazy appearance of retinochoroidal layer; engorged vortex veins are noted in the inferonasal equatorial region.  Diagnosed with postop corneal edema/melt OS. Suspected due to postop ketorolac use. BCL placed, started on moxifloxacin QID, muro QID.  - 07/29/21 OCB VA CF at 3', irregular epithelium with 1+ edema, 1+ folds, trace cell/flare. BCL was d/c'ed. Continued on ofloxacin TID OS. Started on pred BID OS. Muro increased to q2h.   - 08/02/21 OCB cornea VA 20/60-2, intact corneal sensation, noted to have paracentral dendrite between 5-9 oc approx 4.4 mm in length with few terminal bulbs, no epi defect, no edema, trace cell/flare. Recommended by Dr. Smith Robert to start Valtrex 500 mg TID, erythryomycin ung 1-2x daily, stop pred, stop ofloxacin, stop muro, use artificial tear gel drops PRN  - Today 08/03/21: Patient reports gradual improvement in vision. Reports mild pain and more severe photosensitivity peaked last week. Denies current pain or photophobia. Currently using Muro 128 QID OS, Prednisolone BID OS, ofloxacin TID OS. 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   - VA OS 20/200 ph 20/70. IOP wnl.  - Exam OS notable for tr central D-folds, 4.5 mm linear epithelial staining paracentrally 5 oc to 9 oc; diffuse PEE. Appearance of epithelial staining could be suspicious for HSV.   Exam also notable for ABMD, which could contribute to lack of reepithelialization    ECC 08/03/21  OD: CD 2146, CV 32, HEX 51  OS: CD 1739, CV 28, HEX 58    Topography 08/03/21  OD: 41.82 and 41.58 @ 172. Astigmatism 0.24 D.  OS: 38.98 and 37.65 @ 96. Astigmatism 1.33 D.    Pentacam 08/03/21:  OD: 0.2 D astigmatism  OS: 0.1 D astigmatism. Inferior steepening.     Corneal sensation: normal OD; OS possible decrease    Imp/Plan:   Agree with Valtrex 500mg  TID PO  Restart emycin ung   Continue Muro 28 QID   Continue PF BID (consider taper at next visit)  Continue Ofloxacin TID   Continue Refresh PM nightly  Send for HSV 1&2 serology today   Consider confocal for nerves in the  future     08/10/21  Started the Valtrex Friday. 2 weeks of Valtrex and then a recheck.  Possible nausea with Valtrex. Vision is still distorted.   Plan:  Continue Valtrex 500mg  TID PO  Continue Erythromycin QHS   Continue Muro 128 TID  Continue PF BID (consider taper at next visit)  Continue Ofloxacin TID     RV 2-3 weeks Pentacam     08/26/21  Vision is cloudy intermittently ("fades out")  Corneal staining is resolved, except for ABMD  Suspect blurry intermittent vision is due to preservatives in drops    Plan:  Stopped valtrex due to nausea  Stop Erythromycin QHS   Continue Muro 128 drops OS TID-could decrease BID x 2-3 weeks then stop  Decrease PF once daily for two weeks then stop  Stop ofloxacin   RV 6 weeks  Cataract evaluation OD              Cataract OD  Visually significant.  BCVA 20/20. Patient endorses glare and halos. Interested in pursuing cataract surgery OD once OS has stabilized.     Dicussed femto and ORA  Anes: block  Surgical coordinator will call patient to schedule the surgery.

## 2021-10-10 NOTE — Telephone Encounter (Signed)
Called pt, left voicemail instructing to call [phone] if ready to schedule.

## 2021-10-27 LAB — LIPID PROFILE (EXT)
Cholesterol (EXT): 165 mg/dL (ref 0–199)
HDL Cholesterol (EXT): 54 mg/dL (ref >39)
LDL Cholesterol (EXT): 101 mg/dL (ref 50–129)
NON HDL Cholesterol (EXT): 111 mg/dL
Triglycerides (EXT): 50 mg/dL (ref 0–150)

## 2021-10-27 LAB — PSA SCREENING (EXT): PSA (EXT): 1.62 ng/mL (ref 0.00–4.00)

## 2021-10-27 LAB — HEMOGLOBIN A1C: HEMOGLOBIN A1C % (INT/EXT): 5.7 % — ABNORMAL HIGH (ref <5.7)

## 2021-11-03 ENCOUNTER — Encounter: Payer: Self-pay | Admitting: Internal Medicine

## 2021-11-03 ENCOUNTER — Ambulatory Visit (INDEPENDENT_AMBULATORY_CARE_PROVIDER_SITE_OTHER): Payer: Managed Care, Other (non HMO) | Admitting: Internal Medicine

## 2021-11-03 VITALS — BP 125/75 | HR 65 | Ht 69.0 in | Wt 206.0 lb

## 2021-11-03 DIAGNOSIS — Z6830 Body mass index (BMI) 30.0-30.9, adult: Secondary | ICD-10-CM

## 2021-11-03 DIAGNOSIS — E6609 Other obesity due to excess calories: Secondary | ICD-10-CM

## 2021-11-03 DIAGNOSIS — Z0001 Encounter for general adult medical examination with abnormal findings: Secondary | ICD-10-CM

## 2021-11-03 DIAGNOSIS — R7303 Prediabetes: Secondary | ICD-10-CM | POA: Diagnosis not present

## 2021-11-03 DIAGNOSIS — Z125 Encounter for screening for malignant neoplasm of prostate: Secondary | ICD-10-CM

## 2021-11-03 DIAGNOSIS — Z1211 Encounter for screening for malignant neoplasm of colon: Secondary | ICD-10-CM

## 2021-11-03 DIAGNOSIS — E7800 Pure hypercholesterolemia, unspecified: Secondary | ICD-10-CM

## 2021-11-03 NOTE — Progress Notes (Signed)
Subjective:    Patient ID: Richard [name], male    DOB: Mar 19, 1960, 61 y.o.   MRN: [id number]  HPI  Patient presents to clinic today for his annual exam.  Flu: never Tetanus: 04/2013 COVID: never Shingrix: 05/2020, 07/2020 PSA screening: 05/2020 Colon screening: 05/2016, Cologuard Vision screening: annually Dentist: every 4 months  Diet: He does eat meat. She consumes more fruits than veggies. He tries to avoid fried foods. He drinks mostly zero sugar drinks and coffee. Exercise: None   Review of Systems  No past medical history on file.  Current Outpatient Medications  Medication Sig Dispense Refill   atorvastatin (LIPITOR) 10 MG tablet Take 1 tablet (10 mg total) by mouth daily. 30 tablet 2   Multiple Vitamin (MULTIVITAMIN) tablet Take 1 tablet by mouth daily.     omega-3 fish oil (MAXEPA) 1000 MG CAPS capsule Take 1 capsule by mouth daily.     No current facility-administered medications for this visit.    No Known Allergies  Family History  Problem Relation Age of Onset   Cancer Mother        liver   Cancer Maternal Uncle        bone   Diabetes Paternal Grandmother    Early death Neg Hx    Hypertension Neg Hx    Stroke Neg Hx     Social History   Socioeconomic History   Marital status: Widowed    Spouse name: Not on file   Number of children: Not on file   Years of education: Not on file   Highest education level: Not on file  Occupational History   Not on file  Tobacco Use   Smoking status: Former    Packs/day: 1.00    Years: 35.00    Total pack years: 35.00    Types: Cigarettes   Smokeless tobacco: Never   Tobacco comments:    02/2019  Substance and Sexual Activity   Alcohol use: No    Alcohol/week: 0.0 standard drinks of alcohol   Drug use: No   Sexual activity: Not on file  Other Topics Concern   Not on file  Social History Narrative   Not on file   Social Determinants of Health   Financial Resource Strain: Not on file  Food  Insecurity: Not on file  Transportation Needs: Not on file  Physical Activity: Not on file  Stress: Not on file  Social Connections: Not on file  Intimate Partner Violence: Not on file     Constitutional: Denies fever, malaise, fatigue, headache or abrupt weight changes.  HEENT: Denies eye pain, eye redness, ear pain, ringing in the ears, wax buildup, runny nose, nasal congestion, bloody nose, or sore throat. Respiratory: Denies difficulty breathing, shortness of breath, cough or sputum production.   Cardiovascular: Denies chest pain, chest tightness, palpitations or swelling in the hands or feet.  Gastrointestinal: Denies abdominal pain, bloating, constipation, diarrhea or blood in the stool.  GU: Denies urgency, frequency, pain with urination, burning sensation, blood in urine, odor or discharge. Musculoskeletal: Denies decrease in range of motion, difficulty with gait, muscle pain or joint pain and swelling.  Skin: Denies redness, rashes, lesions or ulcercations.  Neurological: Denies dizziness, difficulty with memory, difficulty with speech or problems with balance and coordination.  Psych: Denies anxiety, depression, SI/HI.  No other specific complaints in a complete review of systems (except as listed in HPI above).     Objective:   Physical Exam  BP 125/75  Pulse 65   Ht 5' 9"  (1.753 m)   Wt 206 lb (93.4 kg)   SpO2 96%   BMI 30.42 kg/m   Wt Readings from Last 3 Encounters:  05/25/20 228 lb (103.4 kg)  01/19/20 230 lb (104.3 kg)  05/21/19 222 lb (100.7 kg)    General: Appears his stated age, obese, in NAD. Skin: Warm, dry and intact. HEENT: Head: normal shape and size; Eyes: sclera white, no icterus, conjunctiva pink, PERRLA and EOMs intact;  Neck:  Neck supple, trachea midline. No masses, lumps or thyromegaly present.  Cardiovascular: Normal rate and rhythm. S1,S2 noted.  No murmur, rubs or gallops noted. No JVD or BLE edema. No carotid bruits  noted. Pulmonary/Chest: Normal effort and positive vesicular breath sounds. No respiratory distress. No wheezes, rales or ronchi noted.  Abdomen: Normal bowel sounds.  Musculoskeletal: Strength 5/5 BUE/BLE. No difficulty with gait.  Neurological: Alert and oriented. Cranial nerves [name]-XII grossly intact. Coordination normal.  Psychiatric: Mood and affect normal. Behavior is normal. Judgment and thought content normal.     BMET    Component Value Date/Time   NA 138 05/25/2020 1538   K 4.1 05/25/2020 1538   CL 102 05/25/2020 1538   CO2 28 05/25/2020 1538   GLUCOSE 95 05/25/2020 1538   BUN 17 05/25/2020 1538   CREATININE 1.07 05/25/2020 1538   CALCIUM 9.5 05/25/2020 1538   GFRNONAA 58 (L) 05/23/2014 1344   GFRAA 67 (L) 05/23/2014 1344    Lipid Panel     Component Value Date/Time   CHOL 239 (H) 05/25/2020 1538   TRIG 276.0 (H) 05/25/2020 1538   HDL 42.90 05/25/2020 1538   CHOLHDL 6 05/25/2020 1538   VLDL 55.2 (H) 05/25/2020 1538   LDLCALC 145 (H) 05/15/2016 1501    CBC    Component Value Date/Time   WBC 9.1 05/25/2020 1538   RBC 5.40 05/25/2020 1538   HGB 15.6 05/25/2020 1538   HCT 46.0 05/25/2020 1538   PLT 244.0 05/25/2020 1538   MCV 85.3 05/25/2020 1538   MCH 30.1 05/23/2014 1344   MCHC 33.9 05/25/2020 1538   RDW 14.3 05/25/2020 1538   LYMPHSABS 3.0 12/25/2014 1011   MONOABS 1.0 12/25/2014 1011   EOSABS 0.2 12/25/2014 1011   BASOSABS 0.0 12/25/2014 1011    Hgb A1C Lab Results  Component Value Date   HGBA1C 5.9 05/25/2020           Assessment & Plan:   Preventative Health Maintenance:  He declines flu shot today Tetanus UTD Encouraged him to get a COVID-vaccine Shingrix UTD Cologuard ordered, advised him to get this completed Encouraged him to consume a balanced diet and exercise regimen Advised him to see an eye doctor and dentist annually We will check CBC, c-Met, lipid, A1c and PSA today   RTC in 6 months, follow up chronic  conditions Webb Silversmith, NP

## 2021-11-03 NOTE — Assessment & Plan Note (Signed)
Encourage diet and exercise for weight loss 

## 2021-11-03 NOTE — Patient Instructions (Signed)
Health Maintenance, Male Adopting a healthy lifestyle and getting preventive care are important in promoting health and wellness. Ask your health care provider about: The right schedule for you to have regular tests and exams. Things you can do on your own to prevent diseases and keep yourself healthy. What should I know about diet, weight, and exercise? Eat a healthy diet  Eat a diet that includes plenty of vegetables, fruits, low-fat dairy products, and lean protein. Do not eat a lot of foods that are high in solid fats, added sugars, or sodium. Maintain a healthy weight Body mass index (BMI) is a measurement that can be used to identify possible weight problems. It estimates body fat based on height and weight. Your health care provider can help determine your BMI and help you achieve or maintain a healthy weight. Get regular exercise Get regular exercise. This is one of the most important things you can do for your health. Most adults should: Exercise for at least 150 minutes each week. The exercise should increase your heart rate and make you sweat (moderate-intensity exercise). Do strengthening exercises at least twice a week. This is in addition to the moderate-intensity exercise. Spend less time sitting. Even light physical activity can be beneficial. Watch cholesterol and blood lipids Have your blood tested for lipids and cholesterol at 62 years of age, then have this test every 5 years. You may need to have your cholesterol levels checked more often if: Your lipid or cholesterol levels are high. You are older than 62 years of age. You are at high risk for heart disease. What should I know about cancer screening? Many types of cancers can be detected early and may often be prevented. Depending on your health history and family history, you may need to have cancer screening at various ages. This may include screening for: Colorectal cancer. Prostate cancer. Skin cancer. Lung  cancer. What should I know about heart disease, diabetes, and high blood pressure? Blood pressure and heart disease High blood pressure causes heart disease and increases the risk of stroke. This is more likely to develop in people who have high blood pressure readings or are overweight. Talk with your health care provider about your target blood pressure readings. Have your blood pressure checked: Every 3-5 years if you are 18-39 years of age. Every year if you are 40 years old or older. If you are between the ages of 65 and 75 and are a current or former smoker, ask your health care provider if you should have a one-time screening for abdominal aortic aneurysm (AAA). Diabetes Have regular diabetes screenings. This checks your fasting blood sugar level. Have the screening done: Once every three years after age 45 if you are at a normal weight and have a low risk for diabetes. More often and at a younger age if you are overweight or have a high risk for diabetes. What should I know about preventing infection? Hepatitis B If you have a higher risk for hepatitis B, you should be screened for this virus. Talk with your health care provider to find out if you are at risk for hepatitis B infection. Hepatitis C Blood testing is recommended for: Everyone born from 1945 through 1965. Anyone with known risk factors for hepatitis C. Sexually transmitted infections (STIs) You should be screened each year for STIs, including gonorrhea and chlamydia, if: You are sexually active and are younger than 62 years of age. You are older than 62 years of age and your   health care provider tells you that you are at risk for this type of infection. Your sexual activity has changed since you were last screened, and you are at increased risk for chlamydia or gonorrhea. Ask your health care provider if you are at risk. Ask your health care provider about whether you are at high risk for HIV. Your health care provider  may recommend a prescription medicine to help prevent HIV infection. If you choose to take medicine to prevent HIV, you should first get tested for HIV. You should then be tested every 3 months for as long as you are taking the medicine. Follow these instructions at home: Alcohol use Do not drink alcohol if your health care provider tells you not to drink. If you drink alcohol: Limit how much you have to 0-2 drinks a day. Know how much alcohol is in your drink. In the [place] one drink equals one 12 oz bottle of beer (355 mL), one 5 oz glass of wine (148 mL), or one 1 oz glass of hard liquor (44 mL). Lifestyle Do not use any products that contain nicotine or tobacco. These products include cigarettes, chewing tobacco, and vaping devices, such as e-cigarettes. If you need help quitting, ask your health care provider. Do not use street drugs. Do not share needles. Ask your health care provider for help if you need support or information about quitting drugs. General instructions Schedule regular health, dental, and eye exams. Stay current with your vaccines. Tell your health care provider if: You often feel depressed. You have ever been abused or do not feel safe at home. Summary Adopting a healthy lifestyle and getting preventive care are important in promoting health and wellness. Follow your health care provider's instructions about healthy diet, exercising, and getting tested or screened for diseases. Follow your health care provider's instructions on monitoring your cholesterol and blood pressure. This information is not intended to replace advice given to you by your health care provider. Make sure you discuss any questions you have with your health care provider. Document Revised: 07/19/2020 Document Reviewed: 07/19/2020 Elsevier Patient Education  2023 Elsevier Inc.  

## 2021-11-04 LAB — COMPLETE METABOLIC PANEL WITH GFR
AG Ratio: 1.8 (calc) (ref 1.0–2.5)
ALT: 22 U/L (ref 9–46)
AST: 15 U/L (ref 10–35)
Albumin: 4.6 g/dL (ref 3.6–5.1)
Alkaline phosphatase (APISO): 41 U/L (ref 35–144)
BUN: 20 mg/dL (ref 7–25)
CO2: 22 mmol/L (ref 20–32)
Calcium: 9.6 mg/dL (ref 8.6–10.3)
Chloride: 107 mmol/L (ref 98–110)
Creat: 0.99 mg/dL (ref 0.70–1.35)
Globulin: 2.6 g/dL (calc) (ref 1.9–3.7)
Glucose, Bld: 89 mg/dL (ref 65–99)
Potassium: 4.4 mmol/L (ref 3.5–5.3)
Sodium: 141 mmol/L (ref 135–146)
Total Bilirubin: 0.6 mg/dL (ref 0.2–1.2)
Total Protein: 7.2 g/dL (ref 6.1–8.1)
eGFR: 87 mL/min/{1.73_m2} (ref >=60)

## 2021-11-04 LAB — CBC
HCT: 47.4 % (ref 38.5–50.0)
Hemoglobin: 16.3 g/dL (ref 13.2–17.1)
MCH: 28.2 pg (ref 27.0–33.0)
MCHC: 34.4 g/dL (ref 32.0–36.0)
MCV: 82.1 fL (ref 80.0–100.0)
MPV: 10 fL (ref 7.5–12.5)
Platelets: 234 10*3/uL (ref 140–400)
RBC: 5.77 10*6/uL (ref 4.20–5.80)
RDW: 13.8 % (ref 11.0–15.0)
WBC: 7.2 10*3/uL (ref 3.8–10.8)

## 2021-11-04 LAB — HEMOGLOBIN A1C
Hgb A1c MFr Bld: 5.5 % of total Hgb (ref <5.7)
Mean Plasma Glucose: 111 mg/dL
eAG (mmol/L): 6.2 mmol/L

## 2021-11-04 LAB — LIPID PANEL
Cholesterol: 284 mg/dL — ABNORMAL HIGH (ref <200)
HDL: 46 mg/dL (ref >=40)
LDL Cholesterol (Calc): 205 mg/dL (calc) — ABNORMAL HIGH
Non-HDL Cholesterol (Calc): 238 mg/dL (calc) — ABNORMAL HIGH (ref <130)
Total CHOL/HDL Ratio: 6.2 (calc) — ABNORMAL HIGH (ref <5.0)
Triglycerides: 167 mg/dL — ABNORMAL HIGH (ref <150)

## 2021-11-04 LAB — PSA: PSA: 1.23 ng/mL (ref <=4.00)

## 2021-11-07 ENCOUNTER — Encounter: Payer: Self-pay | Admitting: Internal Medicine

## 2021-11-10 MED ORDER — bromfenac (Prolensa) 0.07 % drops
0.07 | Freq: Every day | OPHTHALMIC | 3 refills | Status: DC
Start: 2021-11-10 — End: 2021-12-23

## 2021-11-12 LAB — CORNEAL TOPOGRAPHY - OU - BOTH EYES
Astigmatism (OD): 0.48
Astigmatism (OS): 0.31

## 2021-11-12 LAB — IOL BIOMETRY - OU - BOTH EYES
A LENGTH (OD): 24.04
AC IOL (OD): 2.93
White to White (OD): 12.1 mm

## 2021-11-15 DIAGNOSIS — Z1211 Encounter for screening for malignant neoplasm of colon: Secondary | ICD-10-CM | POA: Diagnosis not present

## 2021-11-16 ENCOUNTER — Telehealth: Payer: Self-pay

## 2021-11-16 DIAGNOSIS — Z1211 Encounter for screening for malignant neoplasm of colon: Secondary | ICD-10-CM

## 2021-11-16 MED ORDER — prednisoLONE acetate (Pred-Forte) 1 % ophthalmic suspension
1 | Freq: Four times a day (QID) | OPHTHALMIC | 3 refills | 6.00000 days | Status: AC
Start: 2021-11-16 — End: 2021-12-16

## 2021-11-16 NOTE — Telephone Encounter (Signed)
Is the Cologuard okay to order?  Thanks,   -Vernona Rieger

## 2021-11-16 NOTE — Telephone Encounter (Signed)
Copied from CRM [id number]. Topic: General - Other >> Nov 15, 2021 12:38 PM Everette C wrote: Reason for CRM: The patient has been directed by their insurance provider to contact their PCP and request that their cologuard screening be coded as preventative in order for it to be covered by insurance  The patient also has additional questions related to potential prescriptions that have been previously discussed with their PCP  Please contact the patient further when possible

## 2021-11-17 NOTE — Telephone Encounter (Signed)
Pt just wanted to make sure his Cologuard was coded as preventative.     Thanks,   -Vernona Rieger

## 2021-11-17 NOTE — Telephone Encounter (Signed)
Okay to order Cologuard.  What questions does he have about his medications?

## 2021-11-19 LAB — COLOGUARD: COLOGUARD: POSITIVE — AB

## 2021-11-21 ENCOUNTER — Telehealth: Payer: Self-pay

## 2021-11-21 DIAGNOSIS — E7800 Pure hypercholesterolemia, unspecified: Secondary | ICD-10-CM

## 2021-11-21 DIAGNOSIS — Z1211 Encounter for screening for malignant neoplasm of colon: Secondary | ICD-10-CM

## 2021-11-21 MED ORDER — ATORVASTATIN CALCIUM 10 MG PO TABS: 10.0000 mg | ORAL_TABLET | Freq: Every day | ORAL | 1 refills | Status: DC

## 2021-11-21 NOTE — Telephone Encounter (Signed)
-----   Message from Richard Munroe, NP sent at 11/20/2021  9:00 AM EDT ----- His cologuard is positive. Would he be agreeable to getting a colonoscopy for further evaluation?

## 2021-11-21 NOTE — Telephone Encounter (Signed)
Pt advised and agreed with the referral for a colonoscopy.   Pt also agreed to start a cholesterol medicine.  Please send to [facility]  He is schedule for a lab only visit 12/27. (He is concerned the medicine with effect his liver and kidneys so he would like that checked as well.)   Thanks,   -Vernona Rieger

## 2021-11-21 NOTE — Telephone Encounter (Signed)
Colonoscopy ordered. RX for Atorvastatin sent to pharmacy. Furture labs orded.

## 2021-11-23 ENCOUNTER — Ambulatory Visit: Payer: PRIVATE HEALTH INSURANCE | Attending: Specialist | Primary: Internal Medicine

## 2021-11-23 ENCOUNTER — Encounter (INDEPENDENT_AMBULATORY_CARE_PROVIDER_SITE_OTHER): Payer: Self-pay | Admitting: *Deleted

## 2021-11-30 ENCOUNTER — Ambulatory Visit
Payer: PRIVATE HEALTH INSURANCE | Attending: Student in an Organized Health Care Education/Training Program | Primary: Internal Medicine

## 2021-12-05 ENCOUNTER — Telehealth: Payer: Self-pay

## 2021-12-05 DIAGNOSIS — Z1211 Encounter for screening for malignant neoplasm of colon: Secondary | ICD-10-CM

## 2021-12-05 NOTE — Telephone Encounter (Signed)
Copied from [place] [id number]. Topic: Referral - Request for Referral >> Dec 01, 2021  1:23 PM Sabas Sous wrote: Has patient seen PCP for this complaint? Yes.   *If NO, is insurance requiring patient see PCP for this issue before PCP can refer them? Referral for which specialty: GI, colonoscopy  Preferred provider/office: in network  Reason for referral:   Needs colonoscopy, must be coded as preventative in order to be covered by insurance.

## 2021-12-05 NOTE — Addendum Note (Signed)
Addended by: Jearld Fenton on: 12/05/2021 01:09 PM   Modules accepted: Orders

## 2021-12-05 NOTE — Telephone Encounter (Signed)
Referral placed.

## 2021-12-06 ENCOUNTER — Other Ambulatory Visit: Payer: Self-pay

## 2021-12-06 ENCOUNTER — Telehealth: Payer: Self-pay

## 2021-12-06 DIAGNOSIS — Z1211 Encounter for screening for malignant neoplasm of colon: Secondary | ICD-10-CM

## 2021-12-06 MED ORDER — GOLYTELY 236 G PO SOLR: 4000.0000 mL | Freq: Once | ORAL | 0 refills | Status: AC

## 2021-12-06 NOTE — Telephone Encounter (Signed)
Gastroenterology Pre-Procedure Review  Request Date: 01/10/22 Requesting Physician: Dr. Allen Norris  PATIENT REVIEW QUESTIONS: The patient responded to the following health history questions as indicated:    Patient does not have anyone at this time to accompany him.  He plans to see what he can do.  I've asked him to call me at least 48hr before his procedure if he does not find anyone. 1. Are you having any GI issues? no 2. Do you have a personal history of Polyps? no 3. Do you have a family history of Colon Cancer or Polyps? no 4. Diabetes Mellitus? no 5. Joint replacements in the past 12 months?no 6. Major health problems in the past 3 months?no 7. Any artificial heart valves, MVP, or defibrillator?no    MEDICATIONS & ALLERGIES:    Patient reports the following regarding taking any anticoagulation/antiplatelet therapy:   Plavix, Coumadin, Eliquis, Xarelto, Lovenox, Pradaxa, Brilinta, or Effient? no Aspirin? no  Patient confirms/reports the following medications:  Current Outpatient Medications  Medication Sig Dispense Refill   atorvastatin (LIPITOR) 10 MG tablet Take 1 tablet (10 mg total) by mouth daily. 90 tablet 1   Multiple Vitamin (MULTIVITAMIN) tablet Take 1 tablet by mouth daily.     omega-3 fish oil (MAXEPA) 1000 MG CAPS capsule Take 1 capsule by mouth daily.     No current facility-administered medications for this visit.    Patient confirms/reports the following allergies:  No Known Allergies  No orders of the defined types were placed in this encounter.   AUTHORIZATION INFORMATION Primary Insurance: 1D#: Group #:  Secondary Insurance: 1D#: Group #:  SCHEDULE INFORMATION: Date: 01/10/22 Time: Location: [place]

## 2021-12-07 ENCOUNTER — Ambulatory Visit: Payer: PRIVATE HEALTH INSURANCE | Attending: Specialist | Primary: Internal Medicine

## 2021-12-21 ENCOUNTER — Ambulatory Visit: Payer: PRIVATE HEALTH INSURANCE | Attending: Specialist | Primary: Internal Medicine

## 2021-12-23 ENCOUNTER — Encounter: Payer: PRIVATE HEALTH INSURANCE | Attending: Specialist | Primary: Internal Medicine

## 2021-12-29 ENCOUNTER — Telehealth: Payer: Self-pay

## 2021-12-29 NOTE — Telephone Encounter (Signed)
Patient contacted office to cancel 01/10/22 colonoscopy due to no transportation.  Procedure has been canceled with Leigh in Endo.  Patient has been advised that his colonoscopy is good for 1 year and he can call back at a later date to have it rescheduled.  Thanks,  [place], [place]

## 2022-01-10 ENCOUNTER — Ambulatory Visit: Admit: 2022-01-10 | Payer: Managed Care, Other (non HMO) | Admitting: Gastroenterology

## 2022-01-10 SURGERY — COLONOSCOPY WITH PROPOFOL
Anesthesia: General

## 2022-02-09 MED ORDER — ofloxacin (Ocuflox) 0.3 % ophthalmic solution
0.3 | Freq: Four times a day (QID) | OPHTHALMIC | 3 refills | 25.00000 days | Status: AC
Start: 2022-02-09 — End: ?

## 2022-02-09 MED ORDER — ketorolac (Acular) 0.5 % ophthalmic solution
0.5 | Freq: Four times a day (QID) | OPHTHALMIC | 3 refills | 33.50000 days | Status: AC
Start: 2022-02-09 — End: ?

## 2022-02-09 MED ORDER — prednisoLONE acetate (Pred-Forte) 1 % ophthalmic suspension
1 | Freq: Four times a day (QID) | OPHTHALMIC | 3 refills | 6.00000 days | Status: AC
Start: 2022-02-09 — End: ?

## 2022-02-21 MED ORDER — ondansetron (Zofran) injection 4 mg
4 | INTRAMUSCULAR | Status: DC | PRN
Start: 2022-02-21 — End: 2022-02-21

## 2022-02-21 MED ORDER — midazolam (Versed) injection
1 | INTRAMUSCULAR | Status: DC | PRN
Start: 2022-02-21 — End: 2022-02-21
  Administered 2022-02-21: 14:00:00 2 via INTRAVENOUS

## 2022-02-21 MED ORDER — prednisoLONE acetate (Pred-Forte) 1 % ophthalmic suspension 1 drop
1 | Freq: Four times a day (QID) | OPHTHALMIC | Status: DC
Start: 2022-02-21 — End: 2022-02-21

## 2022-02-21 MED ORDER — epinephrine-lidocaine 0.25 mg/mL-7.5 mg/mL in BSS  - Omnicell Override Pull
0.25 | Status: AC
Start: 2022-02-21 — End: ?

## 2022-02-21 MED ORDER — lidocaine-bupivacaine-hyaluronidase 1.8%-0.34%-122 units ophthalmic injection  - Omnicell Override Pull
INTRAOCULAR | Status: AC
Start: 2022-02-21 — End: ?

## 2022-02-21 MED ORDER — remifentanil (Ultiva) injection
2 | INTRAVENOUS | Status: DC | PRN
Start: 2022-02-21 — End: 2022-02-21
  Administered 2022-02-21: 14:00:00 25 via INTRAVENOUS
  Administered 2022-02-21: 14:00:00 75 via INTRAVENOUS

## 2022-02-21 MED ORDER — lidocaine 1.8%-bupivacaine 0.34%-hyaluronidase 122 units ophthalmic injection
40 | INTRAMUSCULAR | Status: DC | PRN
Start: 2022-02-21 — End: 2022-02-21
  Administered 2022-02-21: 14:00:00 5 via TOPICAL

## 2022-02-21 MED ORDER — ketorolac (Acular) 0.5 % ophthalmic solution 1 drop
0.5 | Freq: Four times a day (QID) | OPHTHALMIC | Status: DC
Start: 2022-02-21 — End: 2022-02-21

## 2022-02-21 MED ORDER — tropicamide (Mydriacyl) 1 % ophthalmic solution  - Omnicell Override Pull
1 | OPHTHALMIC | Status: AC
Start: 2022-02-21 — End: ?

## 2022-02-21 MED ORDER — ketorolac (Acular) 0.5 % ophthalmic solution 1 drop
0.5 | OPHTHALMIC | Status: AC
Start: 2022-02-21 — End: 2022-02-21
  Administered 2022-02-21 (×3): 1 [drp] via OPHTHALMIC

## 2022-02-21 MED ORDER — tropicamide (Mydriacyl) 1 % ophthalmic solution 1 drop
1 | OPHTHALMIC | Status: AC
Start: 2022-02-21 — End: 2022-02-21
  Administered 2022-02-21 (×3): 1 [drp] via OPHTHALMIC

## 2022-02-21 MED ORDER — cyclopentolate (Cyclogyl) 1 % ophthalmic solution 1 drop
1 | OPHTHALMIC | Status: AC
Start: 2022-02-21 — End: 2022-02-21
  Administered 2022-02-21 (×3): 1 [drp] via OPHTHALMIC

## 2022-02-21 MED ORDER — acetaminophen (Tylenol) tablet 650 mg
325 | ORAL | Status: DC | PRN
Start: 2022-02-21 — End: 2022-02-21

## 2022-02-21 MED ORDER — moxifloxacin (Vigamox) 0.5 % ophthalmic solution 1 drop
0.5 | OPHTHALMIC | Status: AC
Start: 2022-02-21 — End: 2022-02-21
  Administered 2022-02-21 (×3): 1 [drp] via OPHTHALMIC

## 2022-02-21 MED ORDER — sodium chloride 0.9 % flush 3 mL
Freq: Three times a day (TID) | INTRAMUSCULAR | Status: DC | PRN
Start: 2022-02-21 — End: 2022-02-21

## 2022-02-21 MED ORDER — ceFAZolin (Ancef) 1 gram injection  - Omnicell Override Pull
1 | INTRAMUSCULAR | Status: AC
Start: 2022-02-21 — End: ?

## 2022-02-21 MED ORDER — midazolam (Versed) 1 mg/mL injection  - Omnicell Override Pull
1 | INTRAMUSCULAR | Status: AC
Start: 2022-02-21 — End: ?

## 2022-02-21 MED ORDER — moxifloxacin (Vigamox) 0.5 % ophthalmic solution  - Omnicell Override Pull
0.5 | OPHTHALMIC | Status: AC
Start: 2022-02-21 — End: ?

## 2022-02-21 MED ORDER — ketorolac (Acular) 0.5 % ophthalmic solution  - Omnicell Override Pull
0.5 | OPHTHALMIC | Status: AC
Start: 2022-02-21 — End: ?

## 2022-02-21 MED ORDER — hyaluronidase (human) (Hylenex) injection 150 Units
150 | Freq: Once | INTRAMUSCULAR | Status: DC
Start: 2022-02-21 — End: 2022-02-21

## 2022-02-21 MED ORDER — phenylephrine (Mydfrin) 2.5 % ophthalmic solution 1 drop
2.5 | OPHTHALMIC | Status: AC
Start: 2022-02-21 — End: 2022-02-21
  Administered 2022-02-21 (×3): 1 [drp] via OPHTHALMIC

## 2022-02-21 MED ORDER — bupivacaine PF (Marcaine) 0.75 % (7.5 mg/mL) injection 33.75 mg
0.75 | Freq: Once | INTRAMUSCULAR | Status: DC
Start: 2022-02-21 — End: 2022-02-21

## 2022-02-21 MED ORDER — remifentaniL HCl-0.9%NaCl (PF) 100 mcg/2 mL (50 mcg/mL) syringe  - Omnicell Override Pull
100 | INTRAVENOUS | Status: AC
Start: 2022-02-21 — End: ?

## 2022-02-21 MED ORDER — proparacaine (Alcaine) 0.5 % ophthalmic solution 1 drop
0.5 | OPHTHALMIC | Status: AC
Start: 2022-02-21 — End: 2022-02-21

## 2022-02-21 MED ORDER — moxifloxacin (Vigamox) 0.5 % ophthalmic solution 1 drop
0.5 | Freq: Four times a day (QID) | OPHTHALMIC | Status: DC
Start: 2022-02-21 — End: 2022-02-21

## 2022-02-21 MED ORDER — epinephrine-lidocaine 0.25 mg/mL-7.5 mg/mL in BSS
0.25 | Status: DC | PRN
Start: 2022-02-21 — End: 2022-02-21
  Administered 2022-02-21: 14:00:00 2

## 2022-02-21 MED ORDER — povidone-iodine 5 % ophthalmic solution  - Omnicell Override Pull
5 | OPHTHALMIC | Status: AC
Start: 2022-02-21 — End: ?

## 2022-02-21 MED ORDER — cyclopentolate (Cyclogyl) 1 % ophthalmic solution  - Omnicell Override Pull
1 | OPHTHALMIC | Status: AC
Start: 2022-02-21 — End: ?

## 2022-02-21 MED ORDER — phenylephrine (Mydfrin) 2.5 % ophthalmic solution  - Omnicell Override Pull
2.5 | OPHTHALMIC | Status: AC
Start: 2022-02-21 — End: ?

## 2022-02-21 MED ORDER — lactated Ringer's infusion
INTRAVENOUS | Status: DC | PRN
Start: 2022-02-21 — End: 2022-02-21
  Administered 2022-02-21: 14:00:00 via INTRAVENOUS

## 2022-02-21 MED FILL — EPINEPHRINE-LIDOCAINE 0.25 MG/ML-7.5 MG/ML BSS 2 ML INJ: 0.25 0.25 mg/mL-7.5 mg/mL | Qty: 2

## 2022-02-21 MED FILL — TROPICAMIDE 1 % EYE DROPS: 1 1 % | OPHTHALMIC | Qty: 15

## 2022-02-21 MED FILL — POVIDONE-IODINE 5 % EYE SOLUTION: 5 5 % | OPHTHALMIC | Qty: 30

## 2022-02-21 MED FILL — KETOROLAC 0.5 % EYE DROPS: 0.5 0.5 % | OPHTHALMIC | Qty: 5

## 2022-02-21 MED FILL — REMIFENTANIL (PF) 100 MCG/2 ML (50 MCG/ML) IN 0.9 % NACL IV SYRINGE: 100 100 mcg/2 mL (50 mcg/mL) | INTRAVENOUS | Qty: 2

## 2022-02-21 MED FILL — CYCLOPENTOLATE 1 % EYE DROPS: 1 1 % | OPHTHALMIC | Qty: 2

## 2022-02-21 MED FILL — LIDOCAINE 1.8%-BUPIVACAINE 0.34%-HYALURONIDASE 122 UNITS OPHTHALMIC INJ - CMPD: INTRAOCULAR | Qty: 9

## 2022-02-21 MED FILL — MIDAZOLAM 1 MG/ML INJECTION SOLUTION WRAPPER: 1 1 mg/mL | INTRAMUSCULAR | Qty: 2

## 2022-02-21 MED FILL — MOXIFLOXACIN 0.5 % EYE DROPS: 0.5 0.5 % | OPHTHALMIC | Qty: 3

## 2022-02-21 MED FILL — PHENYLEPHRINE 2.5 % EYE DROPS: 2.5 2.5 % | OPHTHALMIC | Qty: 2

## 2022-02-21 MED FILL — CEFAZOLIN 1 GRAM SOLUTION FOR INJECTION: 1 1 gram | INTRAMUSCULAR | Qty: 1000

## 2022-02-21 NOTE — Anesthesia Post-Procedure Evaluation (Signed)
Patient: Brian Tucker    Procedure Summary     Date: 02/21/22 Room / Location: [facility] OR 22 / [facility] Operating [facility]    Anesthesia Start: [phone] Anesthesia Stop: 0943    Procedure: Extraction, Cataract, Extracapsular, With IOL Insertion (Right) Diagnosis:       Age-related nuclear cataract, right eye      (Age-related nuclear cataract, right eye [H25.11])    Surgeons: Lajuan Lines, MD Responsible Provider: Blair Heys, MD    Anesthesia Type: MAC ASA Status: 2          Anesthesia Type: MAC    Vitals Value Taken Time   BP 115/70 02/21/22 0924   Temp 36.6 C (97.9 F) 02/21/22 0922   Pulse 70 02/21/22 0928   Resp 20 02/21/22 0928   SpO2 99 % 02/21/22 0928   Vitals shown include unfiled device data.    Anesthesia Post Evaluation Note:    Patient location during evaluation: PACU  Patient participation: able to participate  Level of consciousness: awake  Cardiovascular and Hydration status: vital signs within acceptable range  Respiratory Status Stable and Airway Patent: yes  Nausea and Vomiting Control Satisfactory: yes  Pain score: 0  Pain management: adequate     Aldrete score reviewed: yes  Vitals reviewed: yes  Unplanned ICU Admission: noPatient has recovered from anesthesia and has returned to baseline mental status, cardiovascular and respiratory function. Pain, nausea, and vomiting are adequately controlled and the patient is adequately hydrated and appropriate for discharge from PACU?: yes      No notable events documented.

## 2022-02-21 NOTE — Discharge Instructions (Signed)
[facility] Post-Operative Instructions    On the first night after surgery, it is normal for your eye to feel scratchy and to have a dull headache. Because of normal postoperative swelling and inflammation, your vision will be blurry. It should improve gradually over the first week and continue to improve over the next several weeks. Start with light foods such as soups, and if you can tolerate these then you may eat a full meal tonight.  Avoid drinking alcohol today because it may interact with the anesthesia medications you received.      PLEASE KEEP THE PATCH AND SHIELD OVER YOUR EYE IN PLACE. YOUR PHYSICIAN WILL REMOVE THE PATCH TOMORROW IN THE CLINIC.     NO EYE DROPS NEED TO BE USED OVERNIGHT.     Please bring all of your eye drops from today and from the pharmacy to your appointment tomorrow.    Wear the hard eye shield every night while sleeping for the first 7 nights after surgery.    No heavy lifting, bending, straining, water in eye, or rubbing the eye for 2 weeks. Do not lift more than 10 lbs.     DO NOT SHOWER ON THE DAY OF SURGERY. AVOID ANY STRENUOUS ACTIVITY. You can wash your face, shower, and/or bathe starting the day after surgery. Please keep the eye closed and avoid getting water in the eye.     You can use typical over-the-counter medication (such as Tylenol or Advil) for pain. Resume all your usual home medications.      IMPORTANT NOTE:  If you experience increasing pain, worsening vision, increasing redness, or severe mucus discharge from your operative eye, it is very important that you call us immediately at [phone]. You may call any time 24/7. We may ask to see you right away because we want to be sure you are not developing an infection.  If you call at night or over the weekend, you may be seen by the on-call ophthalmologist. If you are out of town contact your local ophthalmologist or go to the nearest emergency department.

## 2022-02-21 NOTE — Anesthesia Pre-Procedure Evaluation (Signed)
Patient: Brian Tucker    Procedure Information     Date/Time: 02/21/22 0840    Procedure: Extraction, Cataract, Extracapsular, With IOL Insertion (Right)    Location: [facility] OR [facility] / [facility] Operating [facility]    Surgeons: Lajuan Lines, MD          Relevant Problems   Anesthesia (within normal limits)      Cardio (within normal limits)      Pulmonary (within normal limits)      GI (within normal limits)      GU/Renal (within normal limits)      Endo   (+) Hypothyroidism      Hematology (within normal limits)      Neuro/Psych (within normal limits)      Musculoskeletal (within normal limits)      Development (within normal limits)      Genetic (within normal limits)       Clinical information reviewed:   Tobacco  Allergies  Meds   Med Hx  Surg Hx   Fam Hx  Soc Hx         Physical Exam    Airway  Mallampati: II  TM distance: >3 FB  Mouth opening: >3 FB  Neck ROM: full  Not able to protrude mandible     Cardiovascular - normal exam     Dental - normal exam     Pulmonary - normal exam     Abdominal    General                Anesthesia Plan    ASA 2     MAC     Airway: natural airway  Monitoring: standard monitors  Postoperative Pain Control: IV/PO analgesics      Anesthetic plan and risks discussed with patient.  Preprocedure Evaluation: No items outstanding.    Complex patient: NoNo Beta Blocker given in last 24 hours       Additional Equipment Requests

## 2022-02-21 NOTE — Op Note (Signed)
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 male who is having surgery for Age-related nuclear cataract, right eye [H25.11].       Procedure Details:  In the pre-op holding area, the operative eye was confirmed and the patient marked accordingly. A timeout confirmed the correct patient, procedure, and operative eye. IV sedation was begun. A peribulbar block with a 1:1 mixture of 2% lidocaine and 0.75% Marcaine with Wydase was given. A total of 5 mL was injected into the peribulbar space. The patient was then brought back to the operating room and a surgical timeout again confirmed the correct patient, procedure, and operative eye. The patient was prepped and draped in the usual sterile fashion for ophthalmic surgery after instillation of tetracaine. A lid speculum was placed. Balanced salt solution was used for irrigation throughout the procedure.     A disposable supersharp blade was used to make a 1 mm paracentesis incision through clear cornea along the limbus. 1 mL of 1% preservative-free Epi-Shugarcaine was irrigated into the anterior chamber. Healon-5 was then injected into the anterior chamber. A Crescent blade and a 2.4-mm keratome were used to  create a tri-planar clear corneal tunnel to the extent of 1.75 mm at the temporal limbus before ultimately entering the anterior chamber. 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   A continuous curvilinear capsulorrhexis was initiated with a cystotome and finished with Utrata forceps without difficulty. Hydrodissection was performed with balanced salt solution on a cannula until the nucleus rotated nicely. The phacoemulsification handpiece was then introduced into the eye and used to remove the nucleus. An irrigation-aspiration handpiece was used to remove the residual cortex.     The capsular bag was then inflated with viscoelastic material. The posterior chamber intraocular lens implant was brought onto the field and confirmed to be the correct make and power. A slight bend in one haptic was straightened with a forcep. The lens implant was introduced into the capsular bag where it was unfolded and rotated into position. The lens was centered.      The Malyugin ring was removed. Residual viscoelastic material was removed using the irrigation-aspiration handpiece. The anterior chamber was pressurized and wounds were hydrated using balanced salt solution and found to be watertight. A 10-0 Nylon suture was placed at the temporal main wound. Weck-cel sponges were used to confirm there was no wound leakage. The eyelid speculum was removed. The eye was dressed with Maxitrol ointment  and a sterile patch and rigid shield were placed over the eye. The patient was taken to recovery room in good condition. Dr. Dorna Bloom was the attending of record and was present and scrubbed for the entirety of the case.       Findings: cataract, right eye    Complications:  None; patient tolerated the procedure well.     Disposition: PACU - hemodynamically stable.  Condition: stable        Lajuan Lines  Phone Number: [phone]

## 2022-02-21 NOTE — Interval H&P Note (Signed)
H&P reviewed. The patient was examined and there are no changes to the H&P.

## 2022-02-22 ENCOUNTER — Ambulatory Visit
Admit: 2022-02-22 | Discharge: 2022-02-22 | Payer: PRIVATE HEALTH INSURANCE | Attending: Specialist | Primary: Internal Medicine

## 2022-02-22 DIAGNOSIS — H2511 Age-related nuclear cataract, right eye: Secondary | ICD-10-CM

## 2022-02-22 DIAGNOSIS — Z961 Presence of intraocular lens: Secondary | ICD-10-CM

## 2022-02-22 NOTE — Progress Notes (Signed)
Self-referred for 2nd opinion of management of post-op CE/IOL OS and consideration of eventual CE/IOL OD. Sister is patient of Dr. Haskell Riling. 1st visit with Dr. Dorna Bloom 08/03/21.     Pseudophakia OD  POD1 s/p phaco IOL OD (02/21/22, Shi/Babara [place])    Doing well, happy with visual improvement.  IOP 22, wounds Seidel negative, IOL centered.    Continue post-op drops in operative eye as prescribed:  Moxifloxacin QID  Prednisolone acetate 1% QID   Prolensa QD    Written post-op instructions reviewed. Keep eye shield on at bedtime/while sleeping for 1 week. No heavy lifting, bending, straining, vigorous exercise for 1 week. No water in the eye for 2 weeks. No eye rubbing.  Follow up for 1 week appointment as scheduled (VA, IOP, ARx).  Patient counseled to report any worsening pain, new or worsening redness, new flashes/floaters or decrease in vision - on-call phone number [phone]) provided.    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 Elnoria Howard at [facility] on 06/30/21  - Patient reports vision OS initially quite good. However within ~10 days post-operatively had gradually decreased vision which slowly progressed. Post-operative history until today's visit as below:     - 07/01/21 OCB VA OS 20/20, normal post-operative changes  - 07/07/21 called to report new onset floaters OS without flashes or curtain/shadow symptoms. Reassured by on-call [facility] fellow.  - 07/11/21 OCB POD10 VA 20/20-2, noted to have new PEEs, reassured, recommended to buy OTC ATs  - 07/20/21 called on-call fellow to report blurriness  and haziness since last post-op visit. Denied pain, redness, light sensitivity. Recommended to start PFATs QID.  - 07/23/21 OCB POW3 VA 20/50+2 ph 20/30, noted to have worsened PEEs, recommended to finish PF taper, stop ketorolac, start PFATs q2h OS, start emycin ung  - 07/25/21 OCB VA 20/80 noted to have severe dry eye OS, recommended to d/c pred, continue PFATs, start Refresh PM BID, d/c erythromycin ointment  - 07/26/21 OCB VA CF at 3', noted to have central epi defect without infiltrate, no cell, BCL placed, start ofloxacin TID OS, PFATs PRN, d/c AT ung  - 07/27/21 MEEI VA CF at 3', noted to have corneal edema, central epi defect, diffuse staining with irregular epithelium, no cell. B scan OS without pseudophacodonesis, choroidal effusion, vitritis. Noted to have Slight thickening and hazy appearance of retinochoroidal layer; engorged vortex veins are noted in the inferonasal equatorial region. Diagnosed with postop corneal edema/melt OS. Suspected due to postop ketorolac use. BCL placed, started on moxifloxacin QID, muro QID.  - 07/29/21 OCB VA CF at 3', irregular epithelium with 1+ edema, 1+ folds, trace cell/flare. BCL was d/c'ed. Continued on ofloxacin TID OS. Started on pred BID OS. Muro increased to q2h.   - 08/02/21 OCB cornea VA 20/60-2, intact corneal sensation, noted to have paracentral dendrite between 5-9 oc approx 4.4 mm in length with few terminal bulbs, no epi defect, no edema, trace cell/flare. Recommended by Dr. Smith Robert to start Valtrex 500 mg TID, erythryomycin  ung 1-2x daily, stop pred, stop ofloxacin, stop muro, use artificial tear gel drops PRN  - Today 08/03/21: Patient reports gradual improvement in vision. Reports mild pain and more severe photosensitivity peaked last week. Denies current pain or photophobia. Currently using Muro 128 QID OS, Prednisolone BID OS, ofloxacin TID OS. 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   - VA OS 20/200 ph 20/70. IOP wnl.  - Exam OS notable for tr central D-folds, 4.5 mm linear epithelial staining paracentrally 5 oc to 9 oc; diffuse PEE. Appearance of epithelial staining could be suspicious for HSV.   Exam also notable for ABMD, which could contribute to lack of reepithelialization    ECC 08/03/21  OD: CD 2146, CV 32, HEX 51  OS: CD 1739, CV 28, HEX 58    Topography 08/03/21  OD: 41.82 and 41.58 @ 172. Astigmatism 0.24 D.  OS: 38.98 and 37.65 @ 96. Astigmatism 1.33 D.    Pentacam 08/03/21:  OD: 0.2 D astigmatism  OS: 0.1 D astigmatism. Inferior steepening.     Corneal sensation: normal OD; OS possible decrease    Imp/Plan:   Agree with Valtrex 500mg  TID PO  Restart emycin ung   Continue Muro 28 QID   Continue PF BID (consider taper at next visit)  Continue Ofloxacin TID   Continue Refresh PM nightly  Send for HSV 1&2 serology today   Consider confocal for nerves in the future     08/10/21  Started the Valtrex Friday. 2 weeks of Valtrex and then a recheck.  Possible nausea with Valtrex. Vision is still distorted.   Plan:  Continue Valtrex 500mg  TID PO  Continue Erythromycin QHS   Continue Muro 128 TID  Continue PF BID (consider taper at next visit)  Continue Ofloxacin TID     RV 2-3 weeks Pentacam     08/26/21  Vision is cloudy intermittently ("fades out")  Corneal staining is resolved, except for ABMD  Suspect blurry intermittent vision is due to preservatives in drops    Plan:  Stopped valtrex due to nausea  Stop Erythromycin QHS   Continue Muro 128 drops OS TID-could decrease BID x 2-3 weeks then stop  Decrease PF once daily for two weeks then stop  Stop ofloxacin   RV 6 weeks  Cataract evaluation OD    02/22/22  RTC 1 week for POW1

## 2022-02-27 ENCOUNTER — Encounter: Payer: PRIVATE HEALTH INSURANCE | Attending: Specialist | Primary: Internal Medicine

## 2022-03-01 ENCOUNTER — Ambulatory Visit
Admit: 2022-03-01 | Discharge: 2022-03-01 | Payer: PRIVATE HEALTH INSURANCE | Attending: Specialist | Primary: Internal Medicine

## 2022-03-01 DIAGNOSIS — Z961 Presence of intraocular lens: Secondary | ICD-10-CM

## 2022-03-01 DIAGNOSIS — H2511 Age-related nuclear cataract, right eye: Secondary | ICD-10-CM

## 2022-03-01 NOTE — Progress Notes (Signed)
Self-referred for 2nd opinion of management of post-op CE/IOL OS and consideration of eventual CE/IOL OD. Sister is patient of Dr. Haskell Riling. 1st visit with Dr. Dorna Bloom 08/03/21.     Pseudophakia OD  POD1 s/p phaco IOL OD (02/21/22, Shi/Aleiah Mohammed)    Doing well, happy with visual improvement.  IOP 22, wounds Seidel negative, IOL centered.    Continue post-op drops in operative eye as prescribed:  Moxifloxacin QID  Prednisolone acetate 1% QID   Prolensa QD    Written post-op instructions reviewed. Keep eye shield on at bedtime/while sleeping for 1 week. No heavy lifting, bending, straining, vigorous exercise for 1 week. No water in the eye for 2 weeks. No eye rubbing.  Follow up for 1 week appointment as scheduled (VA, IOP, ARx).  Patient counseled to report any worsening pain, new or worsening redness, new flashes/floaters or decrease in vision - on-call phone number [phone]) provided.    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 Elnoria Howard at [facility] on 06/30/21  - Patient reports vision OS initially quite good. However within ~10 days post-operatively had gradually decreased vision which slowly progressed. Post-operative history until today's visit as below:     - 07/01/21 OCB VA OS 20/20, normal post-operative changes  - 07/07/21 called to report new onset floaters OS without flashes or curtain/shadow symptoms. Reassured by on-call [facility] fellow.  - 07/11/21 OCB POD10 VA 20/20-2, noted to have new PEEs, reassured, recommended to buy OTC ATs  - 07/20/21 called on-call fellow to report blurriness  and haziness since last post-op visit. Denied pain, redness, light sensitivity. Recommended to start PFATs QID.  - 07/23/21 OCB POW3 VA 20/50+2 ph 20/30, noted to have worsened PEEs, recommended to finish PF taper, stop ketorolac, start PFATs q2h OS, start emycin ung  - 07/25/21 OCB VA 20/80 noted to have severe dry eye OS, recommended to d/c pred, continue PFATs, start Refresh PM BID, d/c erythromycin ointment  - 07/26/21 OCB VA CF at 3', noted to have central epi defect without infiltrate, no cell, BCL placed, start ofloxacin TID OS, PFATs PRN, d/c AT ung  - 07/27/21 MEEI VA CF at 3', noted to have corneal edema, central epi defect, diffuse staining with irregular epithelium, no cell. B scan OS without pseudophacodonesis, choroidal effusion, vitritis. Noted to have Slight thickening and hazy appearance of retinochoroidal layer; engorged vortex veins are noted in the inferonasal equatorial region. Diagnosed with postop corneal edema/melt OS. Suspected due to postop ketorolac use. BCL placed, started on moxifloxacin QID, muro QID.  - 07/29/21 OCB VA CF at 3', irregular epithelium with 1+ edema, 1+ folds, trace cell/flare. BCL was d/c'ed. Continued on ofloxacin TID OS. Started on pred BID OS. Muro increased to q2h.   - 08/02/21 OCB cornea VA 20/60-2, intact corneal sensation, noted to have paracentral dendrite between 5-9 oc approx 4.4 mm in length with few terminal bulbs, no epi defect, no edema, trace cell/flare. Recommended by Dr. Smith Robert to start Valtrex 500 mg TID, erythryomycin  ung 1-2x daily, stop pred, stop ofloxacin, stop muro, use artificial tear gel drops PRN  - Today 08/03/21: Patient reports gradual improvement in vision. Reports mild pain and more severe photosensitivity peaked last week. Denies current pain or photophobia. Currently using Muro 128 QID OS, Prednisolone BID OS, ofloxacin TID OS. 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   - VA OS 20/200 ph 20/70. IOP wnl.  - Exam OS notable for tr central D-folds, 4.5 mm linear epithelial staining paracentrally 5 oc to 9 oc; diffuse PEE. Appearance of epithelial staining could be suspicious for HSV.   Exam also notable for ABMD, which could contribute to lack of reepithelialization    ECC 08/03/21  OD: CD 2146, CV 32, HEX 51  OS: CD 1739, CV 28, HEX 58    Topography 08/03/21  OD: 41.82 and 41.58 @ 172. Astigmatism 0.24 D.  OS: 38.98 and 37.65 @ 96. Astigmatism 1.33 D.    Pentacam 08/03/21:  OD: 0.2 D astigmatism  OS: 0.1 D astigmatism. Inferior steepening.     Corneal sensation: normal OD; OS possible decrease    Imp/Plan:   Agree with Valtrex 500mg  TID PO  Restart emycin ung   Continue Muro 28 QID   Continue PF BID (consider taper at next visit)  Continue Ofloxacin TID   Continue Refresh PM nightly  Send for HSV 1&2 serology today   Consider confocal for nerves in the future     08/10/21  Started the Valtrex Friday. 2 weeks of Valtrex and then a recheck.  Possible nausea with Valtrex. Vision is still distorted.   Plan:  Continue Valtrex 500mg  TID PO  Continue Erythromycin QHS   Continue Muro 128 TID  Continue PF BID (consider taper at next visit)  Continue Ofloxacin TID     RV 2-3 weeks Pentacam     08/26/21  Vision is cloudy intermittently ("fades out")  Corneal staining is resolved, except for ABMD  Suspect blurry intermittent vision is due to preservatives in drops    Plan:  Stopped valtrex due to nausea  Stop Erythromycin QHS   Continue Muro 128 drops OS TID-could decrease BID x 2-3 weeks then stop  Decrease PF once daily for two weeks then stop  Stop ofloxacin   RV 6 weeks  Cataract evaluation OD    03/01/22    PLAN:  Written instructions for taper schedule for eye drops provided:  Taper Prednisolone TID-BID-once/day   Continue Prolensa once/day for one week then stop  Stop Antibiotic eye drop  Artificial tears as needed.     At 1 month visit: refract OU, dilate.

## 2022-03-08 ENCOUNTER — Telehealth: Payer: Self-pay | Admitting: Internal Medicine

## 2022-03-08 ENCOUNTER — Other Ambulatory Visit: Payer: BC Managed Care – PPO

## 2022-03-08 DIAGNOSIS — E7800 Pure hypercholesterolemia, unspecified: Secondary | ICD-10-CM | POA: Diagnosis not present

## 2022-03-08 NOTE — Telephone Encounter (Signed)
Pt stopped by the front desk after having labs done stating that he is out of his Rx atorvastatin and has been out of it since Monday 03/06/2022 and would like to see if it can be called in to the pharmacy today.   Pharm:  [facility] in [place] on [address].

## 2022-03-09 ENCOUNTER — Encounter: Payer: Self-pay | Admitting: Internal Medicine

## 2022-03-09 LAB — LIPID PANEL
Cholesterol: 194 mg/dL (ref <200)
HDL: 46 mg/dL (ref >=40)
LDL Cholesterol (Calc): 128 mg/dL (calc) — ABNORMAL HIGH
Non-HDL Cholesterol (Calc): 148 mg/dL (calc) — ABNORMAL HIGH (ref <130)
Total CHOL/HDL Ratio: 4.2 (calc) (ref <5.0)
Triglycerides: 98 mg/dL (ref <150)

## 2022-03-09 LAB — COMPREHENSIVE METABOLIC PANEL
AG Ratio: 1.5 (calc) (ref 1.0–2.5)
ALT: 19 U/L (ref 9–46)
AST: 19 U/L (ref 10–35)
Albumin: 4.5 g/dL (ref 3.6–5.1)
Alkaline phosphatase (APISO): 40 U/L (ref 35–144)
BUN: 16 mg/dL (ref 7–25)
CO2: 24 mmol/L (ref 20–32)
Calcium: 9 mg/dL (ref 8.6–10.3)
Chloride: 101 mmol/L (ref 98–110)
Creat: 1 mg/dL (ref 0.70–1.35)
Globulin: 3 g/dL (calc) (ref 1.9–3.7)
Glucose, Bld: 84 mg/dL (ref 65–99)
Potassium: 4 mmol/L (ref 3.5–5.3)
Sodium: 136 mmol/L (ref 135–146)
Total Bilirubin: 0.6 mg/dL (ref 0.2–1.2)
Total Protein: 7.5 g/dL (ref 6.1–8.1)

## 2022-03-09 MED ORDER — ATORVASTATIN CALCIUM 20 MG PO TABS: 20.0000 mg | ORAL_TABLET | Freq: Every day | ORAL | 0 refills | Status: DC

## 2022-03-10 NOTE — Telephone Encounter (Signed)
Richard [name] ordered and sent this yesterday.

## 2022-03-20 ENCOUNTER — Encounter: Payer: PRIVATE HEALTH INSURANCE | Attending: Specialist | Primary: Internal Medicine

## 2022-03-24 ENCOUNTER — Encounter: Payer: PRIVATE HEALTH INSURANCE | Attending: Specialist | Primary: Internal Medicine

## 2022-04-05 ENCOUNTER — Ambulatory Visit
Admit: 2022-04-05 | Discharge: 2022-04-05 | Payer: PRIVATE HEALTH INSURANCE | Attending: Specialist | Primary: Internal Medicine

## 2022-04-05 DIAGNOSIS — B880 Other acariasis: Secondary | ICD-10-CM

## 2022-04-05 DIAGNOSIS — Z961 Presence of intraocular lens: Secondary | ICD-10-CM

## 2022-04-05 MED ORDER — lotilaner (Xdemvy) 0.25 % drops
0.25 | Freq: Two times a day (BID) | OPHTHALMIC | 0 refills | Status: DC
Start: 2022-04-05 — End: 2022-04-05

## 2022-04-05 MED ORDER — lotilaner (Xdemvy) 0.25 % drops
0.25 | Freq: Two times a day (BID) | OPHTHALMIC | 0 refills | Status: AC
Start: 2022-04-05 — End: 2022-05-17

## 2022-04-05 NOTE — Telephone Encounter (Signed)
left message let the patient know  the reason send to mail prescription because [facility] not coverage your insurance that why we send to mail order if you have any question please contact with [place]. Thank you

## 2022-04-05 NOTE — Progress Notes (Signed)
Self-referred for 2nd opinion of management of post-op CE/IOL OS and consideration of eventual CE/IOL OD. Sister is patient of Dr. Meredith Staggers. 1st visit with Dr. Elba Barman 08/03/21.     Pseudophakia OD  POM1 s/p phaco IOL OD (02/21/22, Shi/Seeley [place])  Doing well, IOP stable. temporal stitch removed today  Well tolerated, no complications.   Restart Vigamox 4x daily for 4 days OD then stop   Likely will need repeat refraction after stitch removal    POD1 s/p phaco IOL OD (03/04/22, Jonni Sanger)  Doing well, happy with visual improvement.    Written post-op instructions reviewed. Keep eye shield on at bedtime/while sleeping for 1 week. No heavy lifting, bending, straining, vigorous exercise for 1 week. No water in the eye for 2 weeks. No eye rubbing.  Follow up for 1 week appointment as scheduled (VA, IOP, ARx).  Patient counseled to report any worsening pain, new or worsening redness, new flashes/floaters or decrease in vision - on-call phone number [phone]) provided.    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 Benson Norway at [place] on 06/30/21  - Patient reports vision OS initially quite good. However within ~10 days post-operatively had gradually decreased vision which slowly progressed. Post-operative history until today's visit as below:     - 07/01/21 OCB VA OS 20/20, normal post-operative changes  - 07/07/21 called to report new onset  floaters OS without flashes or curtain/shadow symptoms. Reassured by on-call [facility] fellow.  - 07/11/21 OCB POD10 VA 20/20-2, noted to have new PEEs, reassured, recommended to buy OTC ATs  - 07/20/21 called on-call fellow to report blurriness and haziness since last post-op visit. Denied pain, redness, light sensitivity. Recommended to start PFATs QID.  - 07/23/21 OCB POW3 VA 20/50+2 ph 20/30, noted to have worsened PEEs, recommended to finish PF taper, stop ketorolac, start PFATs q2h OS, start emycin ung  - 07/25/21 OCB VA 20/80 noted to have severe dry eye OS, recommended to d/c pred, continue PFATs, start Refresh PM BID, d/c erythromycin ointment  - 07/26/21 OCB VA CF at 3', noted to have central epi defect without infiltrate, no cell, BCL placed, start ofloxacin TID OS, PFATs PRN, d/c AT ung  - 07/27/21 MEEI VA CF at 3', noted to have corneal edema, central epi defect, diffuse staining with irregular epithelium, no cell. B scan OS without pseudophacodonesis, choroidal effusion, vitritis. Noted to have Slight thickening and hazy appearance of retinochoroidal layer; engorged vortex veins are noted in the inferonasal equatorial region. Diagnosed with postop corneal edema/melt OS. Suspected due to postop ketorolac use. BCL placed, started on moxifloxacin QID, muro QID.  - 07/29/21 OCB VA CF at 3', irregular epithelium with 1+ edema, 1+ folds, trace cell/flare. BCL was d/c'ed. Continued on ofloxacin TID OS. Started on pred BID OS.  Muro increased to q2h.   - 08/02/21 OCB cornea VA 20/60-2, intact corneal sensation, noted to have paracentral dendrite between 5-9 oc approx 4.4 mm in length with few terminal bulbs, no epi defect, no edema, trace cell/flare. Recommended by Dr. Janese Banks to start Valtrex 500 mg TID, erythryomycin ung 1-2x daily, stop pred, stop ofloxacin, stop muro, use artificial tear gel drops PRN  - Today 08/03/21: Patient reports gradual improvement in vision. Reports mild pain and more severe photosensitivity peaked  last week. Denies current pain or photophobia. Currently using Muro 128 QID OS, Prednisolone BID OS, ofloxacin TID OS. 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 Janese Banks yesterday so has not started recommended treatments.   - VA OS 20/200 ph 20/70. IOP wnl.  - Exam OS notable for tr central D-folds, 4.5 mm linear epithelial staining paracentrally 5 oc to 9 oc; diffuse PEE. Appearance of epithelial staining could be suspicious for HSV.   Exam also notable for ABMD, which could contribute to lack of reepithelialization    ECC 08/03/21  OD: CD 2146, CV 32, HEX 51  OS: CD 1739, CV 28, HEX 58    Topography 08/03/21  OD: 41.82 and 41.58 @ 172. Astigmatism 0.24 D.  OS: 38.98 and 37.65 @ 96. Astigmatism 1.33 D.    Pentacam 08/03/21:  OD: 0.2 D astigmatism  OS: 0.1 D astigmatism. Inferior steepening.     Corneal sensation: normal OD; OS possible decrease    Imp/Plan:   Agree with Valtrex 500mg  TID PO  Restart emycin ung   Continue Muro 28 QID   Continue PF BID (consider taper at next visit)  Continue Ofloxacin TID   Continue Refresh PM nightly  Send for HSV 1&2 serology today   Consider confocal for nerves in the future     08/10/21  Started the Valtrex Friday. 2 weeks of Valtrex and then a recheck.  Possible nausea with Valtrex. Vision is still distorted.   Plan:  Continue Valtrex 500mg  TID PO  Continue Erythromycin QHS   Continue Muro 128 TID  Continue PF BID (consider taper at next visit)  Continue Ofloxacin TID     RV 2-3 weeks Pentacam     08/26/21  Vision is cloudy intermittently ("fades out")  Corneal staining is resolved, except for ABMD  Suspect blurry intermittent vision is due to preservatives in drops    Plan:  Stopped valtrex due to nausea  Stop Erythromycin QHS   Continue Muro 128 drops OS TID-could decrease BID x 2-3 weeks then stop  Decrease PF once daily for two weeks then stop  Stop ofloxacin   RV 6 weeks  Cataract evaluation OD    03/01/22    PLAN:  Written instructions for taper  schedule for eye drops provided:  Taper Prednisolone TID-BID-once/day   Continue Prolensa once/day for one week then stop  Stop Antibiotic eye drop  Artificial tears as needed.     At 1 month visit: refract OU, dilate.

## 2022-05-23 DIAGNOSIS — Z1283 Encounter for screening for malignant neoplasm of skin: Secondary | ICD-10-CM | POA: Diagnosis not present

## 2022-05-23 DIAGNOSIS — D485 Neoplasm of uncertain behavior of skin: Secondary | ICD-10-CM | POA: Diagnosis not present

## 2022-05-23 DIAGNOSIS — D225 Melanocytic nevi of trunk: Secondary | ICD-10-CM | POA: Diagnosis not present

## 2022-05-24 DIAGNOSIS — D225 Melanocytic nevi of trunk: Secondary | ICD-10-CM | POA: Diagnosis not present

## 2022-06-05 ENCOUNTER — Other Ambulatory Visit: Payer: Self-pay | Admitting: Internal Medicine

## 2022-06-06 NOTE — Telephone Encounter (Signed)
Requested Prescriptions  Pending Prescriptions Disp Refills   atorvastatin (LIPITOR) 20 MG tablet [Pharmacy Med Name: ATORVASTATIN 20MG  TABLETS] 90 tablet 0    Sig: TAKE 1 TABLET(20 MG) BY MOUTH DAILY     Cardiovascular:  Antilipid - Statins Failed - 06/05/2022  3:34 AM      Failed - Lipid Panel in normal range within the last 12 months    Cholesterol  Date Value Ref Range Status  03/08/2022 194 <200 mg/dL Final   LDL Cholesterol (Calc)  Date Value Ref Range Status  03/08/2022 128 (H) mg/dL (calc) Final    Comment:    Reference range: <100 . Desirable range <100 mg/dL for primary prevention;   <70 mg/dL for patients with CHD or diabetic patients  with > or = 2 CHD risk factors. Marland Kitchen LDL-C is now calculated using the Martin-Hopkins  calculation, which is a validated novel method providing  better accuracy than the Friedewald equation in the  estimation of LDL-C.  Cresenciano Genre et al. Annamaria Helling. MU:[id number]): 2061-2068  (http://education.[employer].com/faq/FAQ164)    Direct LDL  Date Value Ref Range Status  05/25/2020 159.0 mg/dL Final    Comment:    Optimal:  <100 mg/dLNear or Above Optimal:  100-129 mg/dLBorderline High:  130-159 mg/dLHigh:  160-189 mg/dLVery High:  >190 mg/dL   HDL  Date Value Ref Range Status  03/08/2022 46 > OR = 40 mg/dL Final   Triglycerides  Date Value Ref Range Status  03/08/2022 98 <150 mg/dL Final         Passed - Patient is not pregnant      Passed - Valid encounter within last 12 months    Recent Outpatient Visits           7 months ago Encounter for general adult medical examination with abnormal findings   [facility], Coralie Keens, NP

## 2022-06-07 NOTE — Telephone Encounter (Signed)
Hello, Pt is requesting a sooner appointment for kidney stones Best number [phone]

## 2022-06-27 DIAGNOSIS — N200 Calculus of kidney: Secondary | ICD-10-CM

## 2022-06-28 ENCOUNTER — Ambulatory Visit
Admit: 2022-06-28 | Discharge: 2022-06-28 | Payer: PRIVATE HEALTH INSURANCE | Attending: Specialist | Primary: Internal Medicine

## 2022-06-28 DIAGNOSIS — Z961 Presence of intraocular lens: Secondary | ICD-10-CM

## 2022-06-28 DIAGNOSIS — H18523 Epithelial (juvenile) corneal dystrophy, bilateral: Secondary | ICD-10-CM

## 2022-06-28 NOTE — Progress Notes (Signed)
Self-referred for 2nd opinion of management of post-op CE/IOL OS and consideration of eventual CE/IOL OD. Sister is patient of Dr. Haskell Riling. 1st visit with Dr. Dorna Bloom 08/03/21.     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  Xdemvy 2 times daily both eyes for 42 days    RV 3 months for surface check     -----------------------------------  Previous notes  S/p CE/IOL OS by Dr. Elnoria Howard at [facility] on 06/30/21  - Patient reports vision OS initially quite good. However within ~10 days post-operatively had gradually decreased vision which slowly progressed. Post-operative history until today's visit as below:     - 07/01/21 OCB VA OS 20/20, normal post-operative changes  - 07/07/21 called to report new onset floaters OS without flashes or curtain/shadow symptoms. Reassured by on-call [facility] fellow.  - 07/11/21 OCB POD10 VA 20/20-2, noted to have new PEEs, reassured, recommended to buy OTC ATs  - 07/20/21 called on-call fellow to report blurriness and haziness since last post-op visit. Denied pain, redness, light sensitivity. Recommended to start PFATs QID.  - 07/23/21 OCB POW3 VA 20/50+2 ph 20/30, noted to have worsened PEEs, recommended to finish PF taper, stop ketorolac, start PFATs q2h OS, start emycin ung  - 07/25/21 OCB VA 20/80 noted to have severe dry eye OS, recommended to d/c pred, continue PFATs, start Refresh PM BID, d/c erythromycin ointment  - 07/26/21 OCB VA CF at 3', noted to have central epi defect without infiltrate, no cell, BCL placed, start ofloxacin TID OS, PFATs PRN, d/c AT ung  - 07/27/21 MEEI VA CF at 3', noted to have corneal edema,  central epi defect, diffuse staining with irregular epithelium, no cell. B scan OS without pseudophacodonesis, choroidal effusion, vitritis. Noted to have Slight thickening and hazy appearance of retinochoroidal layer; engorged vortex veins are noted in the inferonasal equatorial region. Diagnosed with postop corneal edema/melt OS. Suspected due to postop ketorolac use. BCL placed, started on moxifloxacin QID, muro QID.  - 07/29/21 OCB VA CF at 3', irregular epithelium with 1+ edema, 1+ folds, trace cell/flare. BCL was d/c'ed. Continued on ofloxacin TID OS. Started on pred BID OS. Muro increased to q2h.   - 08/02/21 OCB cornea VA 20/60-2, intact corneal sensation, noted to have paracentral dendrite between 5-9 oc approx 4.4 mm in length with few terminal bulbs, no epi defect, no edema, trace cell/flare. Recommended by Dr. Smith Robert to start Valtrex 500 mg TID, erythryomycin ung 1-2x daily, stop pred, stop ofloxacin, stop muro, use artificial tear gel drops PRN  - Today 08/03/21: Patient reports gradual improvement in vision. Reports mild pain and more severe photosensitivity peaked last week. Denies current pain or photophobia. Currently using Muro 128 QID OS, Prednisolone BID OS, ofloxacin TID OS. 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   - VA OS 20/200 ph 20/70. IOP wnl.  - Exam OS notable for tr central D-folds, 4.5 mm linear epithelial staining paracentrally 5 oc  to 9 oc; diffuse PEE. Appearance of epithelial staining could be suspicious for HSV.   Exam also notable for ABMD, which could contribute to lack of reepithelialization    ECC 08/03/21  OD: CD 2146, CV 32, HEX 51  OS: CD 1739, CV 28, HEX 58    Topography 08/03/21  OD: 41.82 and 41.58 @ 172. Astigmatism 0.24 D.  OS: 38.98 and 37.65 @ 96. Astigmatism 1.33 D.    Pentacam 08/03/21:  OD: 0.2 D astigmatism  OS: 0.1 D astigmatism. Inferior steepening.     Corneal sensation: normal OD;  OS possible decrease    Imp/Plan:   Agree with Valtrex 500mg  TID PO  Restart emycin ung   Continue Muro 28 QID   Continue PF BID (consider taper at next visit)  Continue Ofloxacin TID   Continue Refresh PM nightly  Send for HSV 1&2 serology today   Consider confocal for nerves in the future     08/10/21  Started the Valtrex Friday. 2 weeks of Valtrex and then a recheck.  Possible nausea with Valtrex. Vision is still distorted.   Plan:  Continue Valtrex 500mg  TID PO  Continue Erythromycin QHS   Continue Muro 128 TID  Continue PF BID (consider taper at next visit)  Continue Ofloxacin TID       08/26/21  Vision is cloudy intermittently ("fades out")  Corneal staining is resolved, except for ABMD  Suspect blurry intermittent vision is due to preservatives in drops    Plan:  Stopped valtrex due to nausea  Stop Erythromycin QHS   Continue Muro 128 drops OS TID-could decrease BID x 2-3 weeks then stop  Decrease PF once daily for two weeks then stop  Stop ofloxacin       03/01/22    PLAN:  Written instructions for taper schedule for eye drops provided:  Taper Prednisolone TID-BID-once/day   Continue Prolensa once/day for one week then stop  Stop Antibiotic eye drop  Artificial tears as needed.

## 2022-07-03 ENCOUNTER — Ambulatory Visit
Admit: 2022-07-03 | Discharge: 2022-07-03 | Payer: PRIVATE HEALTH INSURANCE | Attending: Student in an Organized Health Care Education/Training Program | Primary: Internal Medicine

## 2022-07-03 DIAGNOSIS — N200 Calculus of kidney: Secondary | ICD-10-CM

## 2022-07-03 LAB — POCT URINALYSIS DIPSTICK
Bilirubin, UA, POC: NEGATIVE
Blood, UA, POC: NEGATIVE
Glucose, UA, POC: NORMAL
Ketones, UA, POC: NEGATIVE
Leukocytes, UA, POC: NEGATIVE
Nitrite, UA, POC: NEGATIVE
Protein, UA, POC: NEGATIVE
Spec Gravity, UA, POC: 1.02 (ref 1.001–1.035)
Urobilinogen, UA, POC: NORMAL
pH, UA, POC: 5 (ref 5.0–8.0)

## 2022-07-03 NOTE — Progress Notes (Signed)
[facility]  [facility]  [address]  [place] [address]  Dept: [phone]  Dept Fax: [phone]    UROLOGY CLINIC NOTE    Chief complaint:  1. Bladder stones  2. LUTS    History of present illness:  This is a 62 y.o. male with a medical history including but not limited to cancer, ABMD of both eyes, cataract (Jan 2023), disease of thyroid gland, HSV epithelial keratitis, hypothyroidism, irregular astigmatism of both eyes, keratitis, left eye pseudophakia, visual impairment, kidney stones, LUTS, and ED, who is here today at the request of Dr. Dyann Kief, MD for Dr. Gwenith Daily' opinion regarding bladder stones.    1. Bladder stones: Since 2021. Previously passed small bladder stones that were uric acid in nature in 10/2020 (previously followed by Dr. Larose Hires [facility]).    Of note, the patient was previously followed by [facility] in 10/2020 and [facility] in 04/2021.    Cystoscopy on 02/21/2021 with Dr. Lucilla Lame [facility]) revealed multiple tiny calcifications in the bladder (ie. Milk of magnesia) creating a snow globe appearance. Bladder outlet obstruction.    Per patient, every couple of months he passes several bladder stones. Stone analysis as below.     Stone Analysis:   09/17/2020: 80% Uric acid dihydrate  20% Calcium oxalate dihydrate         2. Lower urinary tract symptoms (LUTS): Since 2020. His symptoms include a slow urinary stream, incomplete bladder emptying, urinary frequency q 2 hours during the day, urinary urgency and nocturia x 1-2. Currently managed with Alfuzosin 10 mg since 04/2021 with moderate effect.    PVR 07/03/22: 267ccs  IPSS: 24 / 4    3. Hematospermia: Since 03/2021. Patient reports multiple episodes of blood in his semen over the past few months. It has yet to resolve. No recent imaging. His last cystoscopy was 02/2021 (details as above).    PSA:   04/01/2016: 1.0.  09/14/2020: 1.54.  10/27/2021: 1.62.       The  patient has no urgent complaints.   Denies gross hematuria, urethral discharge, pyuria, dysuria, fever, chills, and weight loss.    Patient Active Problem List   Diagnosis   . Keratitis   . 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 HSV epithelial keratitis   . Cataract   . Irregular astigmatism of both eyes   . Pseudophakia, left eye   . Hypothyroidism      Family History   Problem Relation Name Age of Onset   . Arthritis Mother Tahaj Staker    . Cancer Mother Martravious Fryson    . Hearing loss Mother Owen Caughlin    . Hypertension Mother Margie Wold    . Cancer Father [name], Sr.       Past Surgical History:   Procedure Laterality Date   . CATARACT EXTRACTION Right 02/21/2022   . COLONOSCOPY     . EYE SURGERY  June 30, 2021   . HERNIA REPAIR  February 2023   . INGUINAL HERNIA REPAIR  February 2023   . OTHER SURGICAL HISTORY      colorectal surgery due to precancer cells   . THYROIDECTOMY       Social History     Tobacco Use   . Smoking status: Never   . Smokeless tobacco: Never   Substance Use Topics   . Alcohol use: Yes     Alcohol/week: 1.0  standard drink of alcohol     Types: 1 Glasses of wine per week     Comment: 1 glass of wine per week   . Drug use: Never       Home Medications:    Current Outpatient Medications:   .  alfuzosin (Uroxatral) 10 mg 24 hr tablet, Take 10 mg by mouth once daily., Disp: , Rfl:   .  ketorolac (Acular) 0.5 % ophthalmic solution, Administer 1 drop into the right eye four times daily. Start 1 day before surgery, Disp: 10 mL, Rfl: 3  .  levothyroxine (Synthroid, Levoxyl) 50 mcg tablet, Take 50 mcg by mouth., Disp: , Rfl:   .  ofloxacin (Ocuflox) 0.3 % ophthalmic solution, Administer 1 drop into the right eye four times daily. Start 1 day before surgery (Patient not taking: Reported on 04/05/2022), Disp: 10 mL, Rfl: 3  .  prednisoLONE acetate (Pred-Forte) 1 % ophthalmic suspension, Administer 1 drop into the right eye four times daily. Start 1 day before  surgery (Patient not taking: Reported on 04/05/2022), Disp: 10 mL, Rfl: 3  .  Prolensa 0.07 % drops, ADMINISTER 1 DROP INTO AFFECTED EYE(S) ONCE DAILY. AFTER SURGERY- NOT COVERED (Patient not taking: Reported on 04/05/2022), Disp: 9 mL, Rfl: 3    Allergies:  No Known Allergies     ROS:   Constitutional: No fevers, chills, weight loss or general weakness.   HENT: No headache or dizziness, otherwise negative.  Eyes: No loss of vision or double vision.   Lungs: No shortness of breath. No new, frequent cough.   Cardiovascular: No chest pain or palpitations.   Gastrointestinal: No abdominal pain, nausea, or vomiting  Genitourinary: See HPI.  Musculoskeletal: No joint/muscle pain or swelling.  Skin: Negative for rash  Endocrine: Negative   Hematologic: No easy bruising or bleeding  Neurological: No seizures, light headedness, or numbness  Psychiatry: No mood or behavioral changes.    Objective:    Vitals:    07/03/22 1141   BP: 97/63   Pulse: 72   Temp: 36.7 C (98.1 F)       Physical exam:  General Appearance: Well-developed, well nourished, alert and cooperative, NAD  HEENT: Normo-cephalic, atraumatic, EOMI, anicteric, vision grossly intact, no nasal discharge.   Skin: Normal color, texture, turgor, with no lesions, eruptions, rashes, bruises or petechiae.  Neck: Supple, no masses. Normal ROM. Trachea is in midline.   Chest: Normal AP diameter. No rib tenderness.  Lungs: Normal respiratory effort, symmetric excursion with no accessory muscle use.  Back: Vertebral column aligned, no masses or tenderness. No CVAT b/l.   Cardiovascular: regular rate, no varicosities  Abdomen: Soft, benign, non-tender, non-distended, no palpable masses.  Bladder non palpable  Extremities: No clubbing, cyanosis or pitting edema.   Musculo-Skeletal: No muscle wasting, joint swelling or tenderness.  Lymphatic: No cervical or inguinal adenopathy.   Neurologic: Oriented x3 with no focal deficits, normal gait.    GU Male:   Phallus: Normally  developed, no lesions.   Glans: No lesions or inflammation.  Meatus: Orthotopic, no discharge.  Scrotum: No mass or tenderness. No hernias, hydroceles or varicoceles  Epididymis: No masses, tenderness or induration.  Testes: Normal size and consistency. No masses, asymmetry, tenderness or atrophy.  Rectal: Normal sphincter tone. No perineal, anal or rectal lesions.  Prostate: 2+ enlarged, smooth, non-tender and symmetrical, no palpable nodules  Bladder: non-tender and not palpable.    Results: I have reviewed and interpreted the labs below:  No results  found for: "PSA"      BMP:   No results found for: "NA", "K", "CL", "CO2", "BUN", "CREATININE", "GLU"      No results found for: "PTH", "CALCIUM"      Last Urinalysis:   No results found for: "PHUR", "PROTUR", "GLUCUR", "KETOUR"   No results found for: "NITRUR", "LEUKOUR", "SQUAUR", "MUCOUR"   No results found for: "BACTUR"   No results found for: "WBCUR", "RBCUR"      Stone Analysis:   09/17/2020: 80% Uric acid dihydrate  20% Calcium oxalate dihydrate         PSA:   04/01/2016: 1.0 - [facility].  09/14/2020: 1.54 - [facility].  10/27/2021: 1.62 - [facility].         Imaging (Reviewed by Dr. Gwenith Daily):  Cystoscopy:  02/21/2021 (Dr. Lucilla Lame - [facility]): Multiple tiny calcifications in the bladder (ie. Milk of magnesia) creating a snow globe appearance. Bladder outlet obstruction.      Assessment/Plan:  This is a 62 y.o. male with the following urologic problems:    1. Bladder and kidney stones: Patient passes several bladder stones every few months x 2-3 years. Likely due to bladder outlet obstruction. Patient also with a history of renal colic in 2018 requiring URS/HLL. No recent imaging.  Plan:  - CTAP w/o contrast to evaluate stone burden.   - Litholink  - Cystoscopy/flow/PVR following CT in ~1 month    2. Lower urinary tract symptoms (LUTS): Symptoms include slow urinary stream, incomplete bladder emptying, urinary frequency q 2 hours  during the day, urinary urgency and nocturia x 1-2. Currently managed with Alfuzosin 10 mg since 04/2021 with persistent moderate-severe bothersome LUTS.   Plan:  - Continue Alfuzosin 10 mg daily.  - Cystoscopy as above.    3. Hematospermia: Multiple episodes over the past 2-3 months.  Discussed likely benign etiology.  Plan:  - Cystoscopy as above.  - PSA    I, Derald Macleod, MD, spent a total of 45 minutes (this includes time spent in preparation of the note) with the patient Brian Tucker reviewing the medical history, labs and imaging for management, treatment, counseling and coordination of care.    Derald Macleod, M.D.  Urology

## 2022-07-03 NOTE — Progress Notes (Signed)
PVR: patient's bladder was scanned with a Verathon U/S scanner at least 3 times with volume 267 mL.

## 2022-07-03 NOTE — Telephone Encounter (Signed)
Hello, patient reached out requesting if his CT scan 05/17 can be rescheduled to this Wednesday afternoon if possible.     Patient stated if not he will keep original appt    Please advise    Best number [phone]    Thank you

## 2022-07-25 NOTE — Telephone Encounter (Signed)
Tried calling patient, no answer, left voicemail for him to call us back. - Pat

## 2022-07-25 NOTE — Telephone Encounter (Signed)
Department Name: urology     Patient: Brian Tucker  MRN: [id number]  Agent: Jefm Miles    [facility] Scheduling Message    Patient requesting call back from: nurse    In regards to: Pt has a few clinical questions in regards to a urine kit that was suppose to be mail out.    Best call back number: [phone]

## 2022-07-26 NOTE — Telephone Encounter (Signed)
All set. See MyChart message 5/15.

## 2022-07-28 ENCOUNTER — Ambulatory Visit: Payer: PRIVATE HEALTH INSURANCE | Primary: Internal Medicine

## 2022-07-28 ENCOUNTER — Inpatient Hospital Stay: Admit: 2022-07-28 | Payer: PRIVATE HEALTH INSURANCE | Primary: Internal Medicine

## 2022-07-28 DIAGNOSIS — N200 Calculus of kidney: Secondary | ICD-10-CM

## 2022-07-29 LAB — PSA TOTAL, SCREEN: PSA TOTAL: 1.6 ng/mL (ref 0.0–4.0)

## 2022-08-04 ENCOUNTER — Ambulatory Visit: Payer: PRIVATE HEALTH INSURANCE | Primary: Internal Medicine

## 2022-08-10 LAB — LITHOLINK 24HR URINE PANEL
Ammonium, Urine: 44 mmol/(24.h) (ref 15–60)
Calcium Oxalate Saturation: 3.15 — ABNORMAL LOW (ref 6.00–10.00)
Calcium Phosphate Saturation: 0.41 — ABNORMAL LOW (ref 0.50–2.00)
Calcium, Urine: 104 mg/(24.h) (ref <250)
Calcium/Creatinine Ratio: 50 mg/g{creat} (ref 34–196)
Calcium/Kg Body Weight: 1.3 mg/kg/d (ref <4.0)
Chloride, Urine: 209 mmol/(24.h) (ref 70–250)
Citrate, Urine: 1102 mg/(24.h) (ref >450)
Creatinine, Urine: 2087 mg/(24.h)
Creatinine/Kg Body Weight: 26.7 mg/kg/d — ABNORMAL HIGH (ref 11.9–24.4)
Cystine, Urine, Qualitative: NEGATIVE
Magnesium, Urine: 109 mg/(24.h) (ref 30–120)
Oxalate, Urine: 35 mg/(24.h) (ref 20–40)
Phosphorus, Urine: 1150 mg/(24.h) (ref 600–1200)
Potassium, Urine: 114 mmol/(24.h) — ABNORMAL HIGH (ref 20–100)
Protein Catabolic Rate: 1.7 g/kg/d — ABNORMAL HIGH (ref 0.8–1.4)
Sodium, Urine: 194 mmol/(24.h) — ABNORMAL HIGH (ref 50–150)
Sulfate, Urine: 77 meq/(24.h) (ref 20–80)
Urea Nitrogen, Urine: 18.56 g/(24.h) — ABNORMAL HIGH (ref 6.00–14.00)
Uric Acid Saturation: 1.94 — ABNORMAL HIGH (ref <1.00)
Uric Acid, Urine: 1196 mg/(24.h) — ABNORMAL HIGH (ref <800)
Urine Volume (Preserved): 1650 mL/(24.h) (ref 500–4000)
pH, 24 hr, Urine: 5.831 (ref 5.800–6.200)

## 2022-08-16 DIAGNOSIS — N210 Calculus in bladder: Secondary | ICD-10-CM

## 2022-08-23 ENCOUNTER — Ambulatory Visit
Admit: 2022-08-23 | Discharge: 2022-08-23 | Payer: PRIVATE HEALTH INSURANCE | Attending: Student in an Organized Health Care Education/Training Program | Primary: Internal Medicine

## 2022-08-23 DIAGNOSIS — N210 Calculus in bladder: Secondary | ICD-10-CM

## 2022-08-23 NOTE — Progress Notes (Signed)
[facility]  [facility]  [address]  [place] [address]  Dept: [phone]  Dept Fax: [phone]    UROLOGY CLINIC NOTE    Chief complaint:  Bladder stones  Lower urinary tract symptoms (LUTS)    History of present illness:  This is a 62 y.o. male with a medical history including but not limited to cancer, ABMD of both eyes, cataract (Jan 2023), disease of thyroid gland, HSV epithelial keratitis, hypothyroidism, irregular astigmatism of both eyes, keratitis, left eye pseudophakia, visual impairment, kidney stones, LUTS, ED, and bladder stones.    He is here today for Cystoscopy, Uroflowmetry, and Post Void Residual (PVR).    1. Bladder stones:  Since 2021. Previously passed small bladder stones that were uric acid in nature in 10/2020 (previously followed by Dr. Larose Hires [facility]).    Of note, the patient was previously followed by [facility] in 10/2020 and [facility] in 04/2021.    Cystoscopy on 02/21/2021 with Dr. Lucilla Lame [facility]) revealed multiple tiny calcifications in the bladder (ie. Milk of magnesia) creating a snow globe appearance. Bladder outlet obstruction.    Per patient, every couple of months he passes several bladder stones. Stone analysis as below.     Stone Analysis:   09/17/2020: 80% Uric acid dihydrate  20% Calcium oxalate dihydrate     LithoLink:   08/02/2022:           CT Scan ABD/Pelvis without contrast on 07/28/2022 revealed no hydronephrosis. No radiopaque renal stones. Thin hyperdensity along the anterior bladder wall, representing layering tiny bladder stones versus wall calcification. No significant bladder wall thickening. Prostatomegaly. 4 mm right lower lobe pulmonary nodule. Consider follow-up chest CT in 12 months.      2. Lower urinary tract symptoms (LUTS):  Since 2020. His symptoms include a slow urinary stream, incomplete bladder emptying, urinary frequency q 2 hours during the day, urinary urgency and  nocturia x 1-2. Currently managed with Alfuzosin 10 mg since 04/2021 with moderate effect.    PVR 07/03/22: 267ccs  IPSS: 24 / 4    3. Hematospermia: Since 03/2021. Patient reports multiple episodes of blood in his semen over the past few months. It has yet to resolve. No recent imaging. His last cystoscopy was 02/2021 (details as above).    The patient has no urgent complaints.   Denies gross hematuria, urethral discharge, pyuria, dysuria, fever, chills, and weight loss.      PSA:   04/01/2016: 1.0 - [facility].  09/14/2020: 1.54 - [facility] [employer].  10/27/2021: 1.62 - [facility] [employer].  07/28/2022: 1.6.       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  Past Surgical History:   Procedure Laterality Date    CATARACT EXTRACTION Right 02/21/2022    COLONOSCOPY      EYE SURGERY  June 30, 2021    HERNIA REPAIR  February 2023    INGUINAL HERNIA REPAIR  February 2023    OTHER SURGICAL HISTORY      colorectal surgery due to precancer cells    THYROIDECTOMY       Social History     Tobacco Use    Smoking status: Never    Smokeless tobacco: Never   Substance Use Topics    Alcohol use: Yes     Alcohol/week: 1.0 standard drink of alcohol     Types: 1 Glasses of wine per week     Comment: 1 glass of wine per week    Drug use: Never       Home Medications:    Current Outpatient Medications:     alfuzosin (Uroxatral) 10 mg 24 hr tablet, Take 10 mg by mouth  once daily., Disp: , Rfl:     ketorolac (Acular) 0.5 % ophthalmic solution, Administer 1 drop into the right eye four times daily. Start 1 day before surgery, Disp: 10 mL, Rfl: 3    levothyroxine (Synthroid, Levoxyl) 50 mcg tablet, Take 50 mcg by mouth., Disp: , Rfl:     ofloxacin (Ocuflox) 0.3 % ophthalmic solution, Administer 1 drop into the right eye four times daily. Start 1 day before surgery (Patient not taking: Reported on 04/05/2022), Disp: 10 mL, Rfl: 3    prednisoLONE acetate (Pred-Forte) 1 % ophthalmic suspension, Administer 1 drop into the right eye four times daily. Start 1 day before surgery (Patient not taking: Reported on 04/05/2022), Disp: 10 mL, Rfl: 3    Prolensa 0.07 % drops, ADMINISTER 1 DROP INTO AFFECTED EYE(S) ONCE DAILY. AFTER SURGERY- NOT COVERED (Patient not taking: Reported on 04/05/2022), Disp: 9 mL, Rfl: 3    Allergies:  No Known Allergies     ROS:   Constitutional: No fevers, chills, weight loss or general weakness.   HENT: No headache or dizziness, otherwise negative.  Eyes: No loss of vision or double vision.   Lungs: No shortness of breath. No new, frequent cough.   Cardiovascular: No chest pain or palpitations.   Gastrointestinal: No abdominal pain, nausea, or vomiting  Genitourinary: See HPI.  Musculoskeletal: No joint/muscle pain or swelling.  Skin: Negative for rash  Endocrine: Negative   Hematologic: No easy bruising or bleeding  Neurological: No seizures, light headedness, or numbness  Psychiatry: No mood or behavioral changes.    Objective:    Vitals:    08/23/22 0948   BP: 105/67   Pulse: 68   Temp: 36.9 C (98.5 F)         Physical exam:  General Appearance: Well-developed, well nourished, alert and cooperative, NAD  HEENT: Normo-cephalic, atraumatic, EOMI, anicteric, vision grossly intact, no nasal discharge.   Skin: Normal color, texture, turgor, with no lesions, eruptions, rashes, bruises or petechiae.  Neck: Supple, no masses. Normal ROM. Trachea is in midline.   Chest:  Normal AP diameter. No rib tenderness.  Lungs: Normal respiratory effort, symmetric excursion with no accessory muscle use.  Back: Vertebral column aligned, no masses or tenderness. No CVAT b/l.   Cardiovascular: regular rate, no varicosities  Abdomen: Soft, benign, non-tender, non-distended, no palpable masses.  Bladder non palpable  Extremities: No clubbing, cyanosis or pitting edema.   Musculo-Skeletal: No muscle wasting, joint swelling or tenderness.  Lymphatic:  No cervical or inguinal adenopathy.   Neurologic: Oriented x3 with no focal deficits, normal gait.    GU Male:   Phallus: Normally developed, no lesions.   Glans: No lesions or inflammation.  Meatus: Orthotopic, no discharge.  Scrotum: No mass or tenderness. No hernias, hydroceles or varicoceles  Epididymis: No masses, tenderness or induration.  Testes: Normal size and consistency. No masses, asymmetry, tenderness or atrophy.  Rectal: Normal sphincter tone. No perineal, anal or rectal lesions.  Prostate: 2+ enlarged, smooth, non-tender and symmetrical, no palpable nodules  Bladder: non-tender and not palpable.    Results: I have reviewed and interpreted the labs below:  No results found for: "PSA"      BMP:   No results found for: "NA", "K", "CL", "CO2", "BUN", "CREATININE", "GLU"      No results found for: "PTH", "CALCIUM"      Last Urinalysis:   Protein, UA, POC (no units)   Date Value   07/03/2022 Negative      No results found for: "NITRUR", "LEUKOUR", "SQUAUR", "MUCOUR"   No results found for: "BACTUR"   Blood, UA, POC (no units)   Date Value   07/03/2022 Negative         Stone Analysis:   09/17/2020: 80% Uric acid dihydrate  20% Calcium oxalate dihydrate         PSA:   04/01/2016: 1.0 - [facility].  09/14/2020: 1.54 - [facility].  10/27/2021: 1.62 - [facility] [employer].  07/28/2022: 1.6.     LithoLink:   08/02/2022:           Imaging (Reviewed by Dr. Gwenith Daily):  Cystoscopy:  02/21/2021 (Dr. Lucilla Lame - [facility]): Multiple tiny  calcifications in the bladder (ie. Milk of magnesia) creating a snow globe appearance. Bladder outlet obstruction.    CT Scan ABD/Pelvis without contrast:  07/28/2022: No hydronephrosis. No radiopaque renal stones. Thin hyperdensity along the anterior bladder wall, representing layering tiny bladder stones versus wall calcification. No significant bladder wall thickening. Prostatomegaly. 4 mm right lower lobe pulmonary nodule. Consider follow-up chest CT in 12 months.      Assessment/Plan:  This is a 62 y.o. male with the following urologic problems:    1. Bladder and kidney stones: Patient passes several bladder stones every few months x 2-3 years. Likely due to bladder outlet obstruction. Patient also with a history of renal colic in 2018 requiring URS/HLL. CTAP on 07/2022 with no renal stones or hydro, layering tiny bladder stones vs calcifications along anterior bladder wall. Litholink from 07/2022 with suboptimal urine volume, hyperuricosuria, and mild acid supersaturation.     Plan:  - Decrease animal proteins, add more citrate to diet.    2. Lower urinary tract symptoms (LUTS): Symptoms include slow urinary stream, incomplete bladder emptying, urinary frequency q 2 hours during the day, urinary urgency and nocturia x 1-2. Currently managed with Alfuzosin 10 mg since 04/2021 with persistent moderate-severe bothersome LUTS. Cystoscopy today with significant intravesical growth and 3+ trabeculation, unable to provide flow.  Discussed outlet obstruction due to large 80gm intravesical prostate and deteriorating bladder function seen on cystoscopy. No bladder stones seen on cysto      Plan:  - Continue Alfuzosin 10 mg daily.  - Schedule [facility] at [facility]    I counseled [name] as to options for ongoing management including observation, combination medical therapy with alpha blocker and 5-AR, catheter usage, or surgery with either HoLEP or open/robotic simple prostatectomy including all risks of each of  these options.  We discussed pros and cons of the surgery including potential complications such as clot retention, stress or urge incontinence; stricture. He would like to proceed with HoLEP with the goal of improving his symptoms and improving bladder emptying.?     Today I reviewed holmium laser enucleation of the prostate as a means to relieve obstruction from benign prostate hypertrophy. Ashby Dawes of the procedure was reviewed including potential side effects. I noted that we would strive to remove all of the obstructing prostate adenoma which would give him the best chance possible of durable relief of the obstruction. I noted the low retreatment rate of less than 1% at 10 years.     Side effects were reviewed including but not limited to infection, hematuria that may last several weeks, worsening urgency frequency and incontinence that may take 3 months to improve, permanent retrograde ejaculation, iatrogenic urethral or bladder injury, risk of anesthesia, delayed scar formation requiring additional intervention, and persistent bothersome urinary symptoms that may require additional therapy.     I emphasized the recovery period in which he should push fluids and minimize straining while there is gross hematuria. I noted that he might experience passage of sloughing tissue anywhere from 3-12 weeks after the procedure. I encouraged him to consider physical therapy as a means to maximize pelvic floor strengthening prior to the surgical intervention to that he can have the quickest recovery possible of urinary control.     In the end all of his questions were answered. He wished to proceed with holmium laser enucleation of the prostate.?I specifically discussed potential for persistent and/or worsening storage symptoms and incontinence which may require additional treatment. He wishes to proceed.??      3. Hematospermia: Multiple episodes over the past 2-3 months.  Discussed likely benign etiology.  Plan:  - observation    I,  Derald Macleod, MD, spent a total of 30 minutes (this includes time spent in preparation of the note) with the patient Brian Tucker reviewing the medical history, labs and imaging for management, treatment, counseling and coordination of care.    Derald Macleod, M.D.  Urology        Procedures?  PROCEDURE PERFORMED: Cystoscopy, Flow Rate, Post Void Residual  PREOPERATIVE DIAGNOSIS: ?  Lower urinary tract symptoms (LUTS), Hematospermia, Kidney stones, and Bladder stone  POSTOPERATIVE DIAGNOSIS: Slow urinary stream: D/t BOO d/t an enlarged prostate. No bladder stones seen.  ATTENDING SURGEON: Derald Macleod, M.D.  ANESTHESIA: Topical 2% Lidocaine gel.  INDICATIONS: See HPI.  DESCRIPTION OF THE PROCEDURE: After obtaining consent from the patient, he was taken to the Cystoscopy suite and placed on the table in the supine position. He was prepped and draped in the usual sterile fashion. Timeout was performed to identify the patient and procedure. 10 ml of 2% Lidocaine gel was introduced into the urethra. After allowing approximately 5 minutes for the anesthesia to take effect, a 16-French Richard [employer] flexible Cystoscope was introduced into the urethra and advanced under direct vision.  The bladder was examined in a systematic fashion with the below findings.  URETHRA: Anterior - Normal.  PROSTATE URETHERA: Posterior - Markedly elongated  PROSTATE: Markedly enlarged  RETROFLEXION: significant intravesical prostatic growth  BLADDER NECK: Open  BLADDER: 3+ trabeculation, no diverticula, stone, tumor, polyp, or dysplasia.  URETERAL ORIFICES: Normal position  SPECIMENS:  none  EBL: None.  COMPLICATIONS: None.  DRAINS: None.  FLOW RATE: Patient unable to urinate into flowmeter. Urinated later in bathroom.   PVR: Not done.  DISPOSITION: Patient tolerated the procedure well, was counseled on the above findings and the left the clinic in satisfactory condition.

## 2022-09-01 ENCOUNTER — Other Ambulatory Visit: Payer: Self-pay | Admitting: Internal Medicine

## 2022-09-01 NOTE — Telephone Encounter (Signed)
Requested Prescriptions  Pending Prescriptions Disp Refills   atorvastatin (LIPITOR) 20 MG tablet [Pharmacy Med Name: ATORVASTATIN 20MG  TABLETS] 90 tablet 0    Sig: TAKE 1 TABLET(20 MG) BY MOUTH DAILY     Cardiovascular:  Antilipid - Statins Failed - 09/01/2022  3:34 AM      Failed - Lipid Panel in normal range within the last 12 months    Cholesterol  Date Value Ref Range Status  03/08/2022 194 <200 mg/dL Final   LDL Cholesterol (Calc)  Date Value Ref Range Status  03/08/2022 128 (H) mg/dL (calc) Final    Comment:    Reference range: <100 . Desirable range <100 mg/dL for primary prevention;   <70 mg/dL for patients with CHD or diabetic patients  with > or = 2 CHD risk factors. Marland Kitchen LDL-C is now calculated using the Martin-Hopkins  calculation, which is a validated novel method providing  better accuracy than the Friedewald equation in the  estimation of LDL-C.  Horald Pollen et al. Lenox Ahr. 1610;960(45): 2061-2068  (http://education.[employer].com/faq/FAQ164)    Direct LDL  Date Value Ref Range Status  05/25/2020 159.0 mg/dL Final    Comment:    Optimal:  <100 mg/dLNear or Above Optimal:  100-129 mg/dLBorderline High:  130-159 mg/dLHigh:  160-189 mg/dLVery High:  >190 mg/dL   HDL  Date Value Ref Range Status  03/08/2022 46 > OR = 40 mg/dL Final   Triglycerides  Date Value Ref Range Status  03/08/2022 98 <150 mg/dL Final         Passed - Patient is not pregnant      Passed - Valid encounter within last 12 months    Recent Outpatient Visits           10 months ago Encounter for general adult medical examination with abnormal findings   [facility], Salvadore Oxford, NP

## 2022-09-01 NOTE — Telephone Encounter (Signed)
SWP, offered to schedule HoLEP for Dr. Gwenith Daily' next available, but he declined and prefers to get through Summer and schedule for 11/28/22. Sent email to OR requesting Holmium laser availability.     Surgery 11/28/22 at 7:30am, arrival 6:15am  H&P 11/14/22 at 10:00am  Clearance [facility] via phone  Pt informed of above, send letter to portal once OK'd.

## 2022-09-04 NOTE — Telephone Encounter (Signed)
Holmium laser confirmed. Surgical letter sent to [facility].

## 2022-09-27 ENCOUNTER — Ambulatory Visit
Admit: 2022-09-27 | Discharge: 2022-09-27 | Payer: PRIVATE HEALTH INSURANCE | Attending: Specialist | Primary: Internal Medicine

## 2022-09-27 DIAGNOSIS — B880 Other acariasis: Secondary | ICD-10-CM

## 2022-09-27 LAB — LIPID PROFILE (EXT)
Cholesterol (EXT): 176 mg/dL (ref <200)
HDL Cholesterol (EXT): 54 mg/dL (ref 35–100)
LDL Cholesterol (EXT): 113 mg/dL (ref 50–129)
NON HDL Cholesterol (EXT): 122 mg/dL
Risk Factor (EXT): 3.3 (ref 0.0–5.0)
Triglycerides (EXT): 47 mg/dL (ref 40–150)

## 2022-09-27 LAB — HEMOGLOBIN A1C
Estimated Average Glucose mg/dL (INT/EXT): 117 mg/dL
HEMOGLOBIN A1C % (INT/EXT): 5.7 % — ABNORMAL HIGH (ref 4.3–5.6)

## 2022-09-27 MED ORDER — lotilaner (Xdemvy) 0.25 % drops
0.25 | Freq: Two times a day (BID) | OPHTHALMIC | 0 refills | Status: DC
Start: 2022-09-27 — End: 2022-11-09

## 2022-09-27 NOTE — Progress Notes (Signed)
Self-referred for 2nd opinion of management of post-op CE/IOL OS and consideration of eventual CE/IOL OD. Sister is patient of Dr. Haskell Riling. 1st visit with Dr. Dorna Bloom 08/03/21.     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 An additional bottle ordered today.   RV 2-3 months for surface check      06/28/22  FBS in the morning with occasional irritation   Has not started Xdemvy yet.  Xdemvy 2 times daily both eyes for 42 days    RV 3 months for surface check     -----------------------------------  Previous notes  S/p CE/IOL OS by Dr. Elnoria Howard at [facility] on 06/30/21  - Patient reports vision OS initially quite good. However within ~10 days post-operatively had gradually decreased vision which slowly progressed. Post-operative history until today's visit as below:     - 07/01/21 OCB VA OS 20/20, normal post-operative changes  - 07/07/21 called to report new onset floaters OS without flashes or curtain/shadow symptoms. Reassured by on-call [facility] fellow.  - 07/11/21 OCB POD10 VA 20/20-2, noted to have new PEEs, reassured, recommended to buy OTC ATs  - 07/20/21 called on-call fellow to report blurriness and haziness since last post-op visit. Denied pain, redness, light sensitivity. Recommended to start PFATs QID.  - 07/23/21 OCB POW3 VA 20/50+2 ph 20/30, noted to have worsened PEEs, recommended to finish PF taper, stop ketorolac, start PFATs q2h OS,  start emycin ung  - 07/25/21 OCB VA 20/80 noted to have severe dry eye OS, recommended to d/c pred, continue PFATs, start Refresh PM BID, d/c erythromycin ointment  - 07/26/21 OCB VA CF at 3', noted to have central epi defect without infiltrate, no cell, BCL placed, start ofloxacin TID OS, PFATs PRN, d/c AT ung  - 07/27/21 MEEI VA CF at 3', noted to have corneal edema, central epi defect, diffuse staining with irregular epithelium, no cell. B scan OS without pseudophacodonesis, choroidal effusion, vitritis. Noted to have Slight thickening and hazy appearance of retinochoroidal layer; engorged vortex veins are noted in the inferonasal equatorial region. Diagnosed with postop corneal edema/melt OS. Suspected due to postop ketorolac use. BCL placed, started on moxifloxacin QID, muro QID.  - 07/29/21 OCB VA CF at 3', irregular epithelium with 1+ edema, 1+ folds, trace cell/flare. BCL was d/c'ed. Continued on ofloxacin TID OS. Started on pred BID OS. Muro increased to q2h.   - 08/02/21 OCB cornea VA 20/60-2, intact corneal sensation, noted to have paracentral dendrite between 5-9 oc approx 4.4 mm in length with few terminal bulbs, no epi defect, no edema, trace cell/flare. Recommended by Dr. Smith Robert to start Valtrex 500 mg TID, erythryomycin ung 1-2x daily, stop pred, stop ofloxacin, stop muro, use artificial tear gel drops PRN  - Today 08/03/21: Patient reports gradual improvement in vision. Reports  mild pain and more severe photosensitivity peaked last week. Denies current pain or photophobia. Currently using Muro 128 QID OS, Prednisolone BID OS, ofloxacin TID OS. 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   - VA OS 20/200 ph 20/70. IOP wnl.  - Exam OS notable for tr central D-folds, 4.5 mm linear epithelial staining paracentrally 5 oc to 9 oc; diffuse PEE. Appearance of epithelial staining could be suspicious for HSV.   Exam also notable for  ABMD, which could contribute to lack of reepithelialization    ECC 08/03/21  OD: CD 2146, CV 32, HEX 51  OS: CD 1739, CV 28, HEX 58    Topography 08/03/21  OD: 41.82 and 41.58 @ 172. Astigmatism 0.24 D.  OS: 38.98 and 37.65 @ 96. Astigmatism 1.33 D.    Pentacam 08/03/21:  OD: 0.2 D astigmatism  OS: 0.1 D astigmatism. Inferior steepening.     Corneal sensation: normal OD; OS possible decrease    Imp/Plan:   Agree with Valtrex 500mg  TID PO  Restart emycin ung   Continue Muro 28 QID   Continue PF BID (consider taper at next visit)  Continue Ofloxacin TID   Continue Refresh PM nightly  Send for HSV 1&2 serology today   Consider confocal for nerves in the future     08/10/21  Started the Valtrex Friday. 2 weeks of Valtrex and then a recheck.  Possible nausea with Valtrex. Vision is still distorted.   Plan:  Continue Valtrex 500mg  TID PO  Continue Erythromycin QHS   Continue Muro 128 TID  Continue PF BID (consider taper at next visit)  Continue Ofloxacin TID       08/26/21  Vision is cloudy intermittently ("fades out")  Corneal staining is resolved, except for ABMD  Suspect blurry intermittent vision is due to preservatives in drops    Plan:  Stopped valtrex due to nausea  Stop Erythromycin QHS   Continue Muro 128 drops OS TID-could decrease BID x 2-3 weeks then stop  Decrease PF once daily for two weeks then stop  Stop ofloxacin       03/01/22    PLAN:  Written instructions for taper schedule for eye drops provided:  Taper Prednisolone TID-BID-once/day   Continue Prolensa once/day for one week then stop  Stop Antibiotic eye drop  Artificial tears as needed.

## 2022-11-08 MED ORDER — tamsulosin (Flomax) 0.4 mg 24 hr capsule
0.4 | ORAL_CAPSULE | Freq: Every day | ORAL | 1 refills | 90.00000 days | Status: DC
Start: 2022-11-08 — End: 2022-11-08

## 2022-11-08 MED ORDER — alfuzosin (Uroxatral) 10 mg 24 hr tablet
10 | ORAL_TABLET | Freq: Every day | ORAL | 1 refills | Status: AC
Start: 2022-11-08 — End: ?

## 2022-11-09 MED ORDER — lotilaner (Xdemvy) 0.25 % drops
0.25 | Freq: Two times a day (BID) | OPHTHALMIC | 0 refills | Status: AC
Start: 2022-11-09 — End: 2022-12-09

## 2022-11-14 ENCOUNTER — Institutional Professional Consult (permissible substitution): Admit: 2022-11-14 | Discharge: 2022-11-14 | Payer: PRIVATE HEALTH INSURANCE | Primary: Internal Medicine

## 2022-11-14 DIAGNOSIS — R399 Unspecified symptoms and signs involving the genitourinary system: Secondary | ICD-10-CM

## 2022-11-14 LAB — POCT URINALYSIS DIPSTICK
Bilirubin, UA, POC: NEGATIVE
Blood, UA, POC: NEGATIVE
Glucose, UA, POC: NEGATIVE
Ketones, UA, POC: NEGATIVE
Leukocytes, UA, POC: NEGATIVE
Nitrite, UA, POC: NEGATIVE
Protein, UA, POC: NEGATIVE
Spec Gravity, UA, POC: 1.02 (ref 1.001–1.035)
Urobilinogen, UA, POC: NORMAL
pH, UA, POC: 6 (ref 5.0–8.0)

## 2022-11-14 NOTE — Progress Notes (Signed)
SURGICAL H&P  [facility]  [address]  [place] [address]  [phone]    Today's Date: 11/14/2022  MRN: [id number]  Name: Brian Tucker  DOB: 1961-01-13    Procedure Details: HoLEP on 11/28/2022 with Dr. Gwenith Daily at [facility]  PAT: 11/15/2022  Anticoagulation: None    History Of Present Illness  This is a 62 y.o. male with a medical history including but not limited to cancer, ABMD of both eyes, cataract (Jan 2023), disease of thyroid gland, HSV epithelial keratitis, hypothyroidism, irregular astigmatism of both eyes, keratitis, left eye pseudophakia, visual impairment, kidney stones, LUTS, ED, and bladder stones.        1. Bladder stones:  Since 2021. Previously passed small bladder stones that were uric acid in nature in 10/2020 (previously followed by Dr. Larose Hires [facility]).     Of note, the patient was previously followed by [facility] in 10/2020 and [facility] in 04/2021.     Cystoscopy on 02/21/2021 with Dr. Lucilla Lame [facility]) revealed multiple tiny calcifications in the bladder (ie. Milk of magnesia) creating a snow globe appearance. Bladder outlet obstruction.     Per patient, every couple of months he passes several bladder stones. Stone analysis as below.      Stone Analysis:   09/17/2020: 80% Uric acid dihydrate  20% Calcium oxalate dihydrate      LithoLink:   08/02/2022:              CT Scan ABD/Pelvis without contrast on 07/28/2022 revealed no hydronephrosis. No radiopaque renal stones. Thin hyperdensity along the anterior bladder wall, representing layering tiny bladder stones versus wall calcification. No significant bladder wall thickening. Prostatomegaly. 4 mm right lower lobe pulmonary nodule. Consider follow-up chest CT in 12 months.        2. Lower urinary tract symptoms (LUTS):  Since 2020. His symptoms include a slow urinary stream, incomplete bladder emptying, urinary frequency q 2 hours during the day, urinary urgency and nocturia x 1-2. Currently  managed with Alfuzosin 10 mg since 04/2021 with moderate effect.     PVR 07/03/22: 267ccs  IPSS: 24 / 4     Cystoscopy 08/2022 with significant intravesical growth and 3+ trabeculation, unable to provide flow. Outlet obstruction due to large 80gm intravesical prostate and deteriorating bladder function seen on cystoscopy. No bladder stones visualized.    3. Hematospermia: Since 03/2021. Patient reports multiple episodes of blood in his semen over the past few months. It has yet to resolve. No recent imaging. His last cystoscopy was 02/2021 (details as above).     The patient has no urgent complaints.   Denies gross hematuria, urethral discharge, pyuria, dysuria, fever, chills, and weight loss.        PSA:   04/01/2016: 1.0 - [facility].  09/14/2020: 1.54 - [facility] [employer].  10/27/2021: 1.62 - [facility] [employer].  07/28/2022: 1.6.        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    HSV epithelial keratitis    Cataract    Irregular astigmatism of both eyes    Pseudophakia, left eye    Hypothyroidism (CMS-HCC)    Kidney stones    Lower urinary tract symptoms (LUTS)    ED (erectile dysfunction) of organic origin    Bladder stones    Hematospermia    Benign prostatic hyperplasia with urinary obstruction  Past Surgical History:   Procedure Laterality Date    CATARACT EXTRACTION Right 02/21/2022    COLONOSCOPY      EYE SURGERY  June 30, 2021    HERNIA REPAIR  February 2023    INGUINAL HERNIA REPAIR  February 2023    OTHER SURGICAL HISTORY      colorectal surgery due to precancer cells    THYROIDECTOMY          Social History     Socioeconomic History    Marital status: Single     Spouse name: Not on file    Number of children: Not on file    Years of education: Not on file    Highest education level: Not on file   Occupational History    Not on file   Tobacco Use    Smoking status: Never    Smokeless tobacco: Never   Substance and Sexual  Activity    Alcohol use: Not Currently     Alcohol/week: 1.0 standard drink of alcohol     Types: 1 Glasses of wine per week     Comment: 1 glass of wine per week    Drug use: Never    Sexual activity: Not Currently   Other Topics Concern    Not on file   Social History Narrative    Not on file     Social Determinants of Health     Financial Resource Strain: Not on file   Food Insecurity: Not on file   Transportation Needs: Not on file   Physical Activity: Not on file   Stress: Not on file   Social Connections: Not on file   Intimate Partner Violence: Not on file   Housing Stability: Not on file       Family History  Reviewed and not pertinent     Advance Care Plan  Extended Emergency Contact Information  Primary Emergency Contact: Levels,Kimberly  Mobile Phone: [phone]  Relation: Sibling  Preferred language: English  Interpreter needed? No  Prior       Allergies  Patient has no known allergies.     Medications    Current Outpatient Medications:     alfuzosin (Uroxatral) 10 mg 24 hr tablet, Take 1 tablet (10 mg) by mouth once daily., Disp: 30 tablet, Rfl: 1    ketorolac (Acular) 0.5 % ophthalmic solution, Administer 1 drop into the right eye four times daily. Start 1 day before surgery, Disp: 10 mL, Rfl: 3    levothyroxine (Synthroid, Levoxyl) 50 mcg tablet, Take 50 mcg by mouth., Disp: , Rfl:     lotilaner (Xdemvy) 0.25 % drops, Administer 1 drop into affected eye(s) twice daily., Disp: 10 mL, Rfl: 0    ofloxacin (Ocuflox) 0.3 % ophthalmic solution, Administer 1 drop into the right eye four times daily. Start 1 day before surgery (Patient not taking: Reported on 04/05/2022), Disp: 10 mL, Rfl: 3    prednisoLONE acetate (Pred-Forte) 1 % ophthalmic suspension, Administer 1 drop into the right eye four times daily. Start 1 day before surgery (Patient not taking: Reported on 04/05/2022), Disp: 10 mL, Rfl: 3    Prolensa 0.07 % drops, ADMINISTER 1 DROP INTO AFFECTED EYE(S) ONCE DAILY. AFTER SURGERY- NOT COVERED (Patient  not taking: Reported on 04/05/2022), Disp: 9 mL, Rfl: 3    Review of Systems  All other systems reviewed and negative.    Objective     Vitals Signs  Blood pressure 109/69, pulse 75, temperature 36.7 C (98  F), temperature source Oral, weight 74.3 kg, SpO2 98%.    Physical Exam  General Appearance: Well-developed, well nourished, NAD.  HEENT: EOMI.  Neck: Supple, normal ROM.   Lungs: No acute respiratory distress.  Cardiovascular: RRR.   Abdomen: Soft, NT, ND. Bladder is not palpable.   Extremities: Moves all extremities  Neurologic: Oriented 3x with no focal deficits.    GU:   Phallus: Normally developed, no lesions.   Glans: No lesions or inflammation.  Meatus: Orthotopic, no discharge.  Scrotum: No mass or tenderness. No hernias, hydroceles or varicoceles  Epididymis: No masses, tenderness or induration.  Testes: Normal size and consistency. No masses, asymmetry, tenderness or atrophy.  Rectal: Normal sphincter tone. No perineal, anal or rectal lesions.  Prostate: 2+ enlarged, smooth, non-tender and symmetrical, no palpable nodules  Bladder: non-tender and not palpable.       Stone Analysis:   09/17/2020: 80% Uric acid dihydrate  20% Calcium oxalate dihydrate            PSA:   04/01/2016: 1.0 - [facility].  09/14/2020: 1.54 - [facility].  10/27/2021: 1.62 - [facility] [employer].  07/28/2022: 1.6.      LithoLink:   08/02/2022:              Imaging (Reviewed by Dr. Gwenith Daily):  Cystoscopy:  02/21/2021 (Dr. Lucilla Lame - [facility]): Multiple tiny calcifications in the bladder (ie. Milk of magnesia) creating a snow globe appearance. Bladder outlet obstruction.     CT Scan ABD/Pelvis without contrast:  07/28/2022: No hydronephrosis. No radiopaque renal stones. Thin hyperdensity along the anterior bladder wall, representing layering tiny bladder stones versus wall calcification. No significant bladder wall thickening. Prostatomegaly. 4 mm right lower lobe pulmonary nodule. Consider follow-up chest CT in 12  months.        Assessment/Plan:  This is a 62 y.o. male with the following urologic problems:     1. Bladder and kidney stones: Patient passes several bladder stones every few months x 2-3 years. Likely due to bladder outlet obstruction. Patient also with a history of renal colic in 2018 requiring URS/HLL. CTAP on 07/2022 with no renal stones or hydro, layering tiny bladder stones vs calcifications along anterior bladder wall. Litholink from 07/2022 with suboptimal urine volume, hyperuricosuria, and mild acid supersaturation.      Plan:  - Decrease animal proteins, add more citrate to diet.     2. Lower urinary tract symptoms (LUTS): Symptoms include slow urinary stream, incomplete bladder emptying, urinary frequency q 2 hours during the day, urinary urgency and nocturia x 1-2. Currently managed with Alfuzosin 10 mg since 04/2021 with persistent moderate-severe bothersome LUTS. Cystoscopy today with significant intravesical growth and 3+ trabeculation, unable to provide flow.  Discussed outlet obstruction due to large 80gm intravesical prostate and deteriorating bladder function seen on cystoscopy. No bladder stones seen on cysto        Plan:  - Proceed with HoLEP on 11/28/2022 at [facility] with Dr. Gwenith Daily  - Consent signed today in clinic  - Urine culture  - Continue Alfuzosin 10 mg daily.  - PAT as scheduled  - NPO p MN reviewed     I counseled Brian Tucker as to options for ongoing management including observation, combination medical therapy with alpha blocker and 5-AR, catheter usage, or surgery with either HoLEP or open/robotic simple prostatectomy including all risks of each of these options. We discussed pros and cons of the surgery including potential complications such as clot retention,  stress or urge incontinence; stricture. He would like to proceed with HoLEP with the goal of improving his symptoms and improving bladder emptying.?      Today I reviewed holmium laser enucleation of the prostate as a means to  relieve obstruction from benign prostate hypertrophy. Ashby Dawes of the procedure was reviewed including potential side effects. I noted that we would strive to remove all of the obstructing prostate adenoma which would give him the best chance possible of durable relief of the obstruction. I noted the low retreatment rate of less than 1% at 10 years.      Side effects were reviewed including but not limited to infection, hematuria that may last several weeks, worsening urgency frequency and incontinence that may take 3 months to improve, permanent retrograde ejaculation, iatrogenic urethral or bladder injury, risk of anesthesia, delayed scar formation requiring additional intervention, and persistent bothersome urinary symptoms that may require additional therapy.      I emphasized the recovery period in which he should push fluids and minimize straining while there is gross hematuria. I noted that he might experience passage of sloughing tissue anywhere from 3-12 weeks after the procedure. I encouraged him to consider physical therapy as a means to maximize pelvic floor strengthening prior to the surgical intervention to that he can have the quickest recovery possible of urinary control.      In the end all of his questions were answered. He wished to proceed with holmium laser enucleation of the prostate.?I specifically discussed potential for persistent and/or worsening storage symptoms and incontinence which may require additional treatment. He wishes to proceed.??       3. Hematospermia: Multiple episodes over the past 2-3 months.  Discussed likely benign etiology.  Plan:  - observation      Rayfield Citizen, PA-C

## 2022-11-14 NOTE — H&P (View-Only) (Signed)
SURGICAL H&P  [facility]  [address]  [place] [address]  [phone]    Today's Date: 11/14/2022  MRN: [id number]  Name: Brian Tucker  DOB: Sep 02, 1960    Procedure Details: HoLEP on 11/28/2022 with Dr. Gwenith Daily at [facility]  PAT: 11/15/2022  Anticoagulation: None    History Of Present Illness  This is a 62 y.o. male with a medical history including but not limited to cancer, ABMD of both eyes, cataract (Jan 2023), disease of thyroid gland, HSV epithelial keratitis, hypothyroidism, irregular astigmatism of both eyes, keratitis, left eye pseudophakia, visual impairment, kidney stones, LUTS, ED, and bladder stones.        1. Bladder stones:  Since 2021. Previously passed small bladder stones that were uric acid in nature in 10/2020 (previously followed by Dr. Larose Hires [facility]).     Of note, the patient was previously followed by [facility] in 10/2020 and [facility] in 04/2021.     Cystoscopy on 02/21/2021 with Dr. Lucilla Lame [facility]) revealed multiple tiny calcifications in the bladder (ie. Milk of magnesia) creating a snow globe appearance. Bladder outlet obstruction.     Per patient, every couple of months he passes several bladder stones. Stone analysis as below.      Stone Analysis:   09/17/2020: 80% Uric acid dihydrate  20% Calcium oxalate dihydrate      LithoLink:   08/02/2022:              CT Scan ABD/Pelvis without contrast on 07/28/2022 revealed no hydronephrosis. No radiopaque renal stones. Thin hyperdensity along the anterior bladder wall, representing layering tiny bladder stones versus wall calcification. No significant bladder wall thickening. Prostatomegaly. 4 mm right lower lobe pulmonary nodule. Consider follow-up chest CT in 12 months.        2. Lower urinary tract symptoms (LUTS):  Since 2020. His symptoms include a slow urinary stream, incomplete bladder emptying, urinary frequency q 2 hours during the day, urinary urgency and nocturia x 1-2. Currently  managed with Alfuzosin 10 mg since 04/2021 with moderate effect.     PVR 07/03/22: 267ccs  IPSS: 24 / 4     Cystoscopy 08/2022 with significant intravesical growth and 3+ trabeculation, unable to provide flow. Outlet obstruction due to large 80gm intravesical prostate and deteriorating bladder function seen on cystoscopy. No bladder stones visualized.    3. Hematospermia: Since 03/2021. Patient reports multiple episodes of blood in his semen over the past few months. It has yet to resolve. No recent imaging. His last cystoscopy was 02/2021 (details as above).     The patient has no urgent complaints.   Denies gross hematuria, urethral discharge, pyuria, dysuria, fever, chills, and weight loss.        PSA:   04/01/2016: 1.0 - [facility].  09/14/2020: 1.54 - [facility] [employer].  10/27/2021: 1.62 - [facility] [employer].  07/28/2022: 1.6.        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    HSV epithelial keratitis    Cataract    Irregular astigmatism of both eyes    Pseudophakia, left eye    Hypothyroidism (CMS-HCC)    Kidney stones    Lower urinary tract symptoms (LUTS)    ED (erectile dysfunction) of organic origin    Bladder stones    Hematospermia    Benign prostatic hyperplasia with urinary obstruction  Past Surgical History:   Procedure Laterality Date    CATARACT EXTRACTION Right 02/21/2022    COLONOSCOPY      EYE SURGERY  June 30, 2021    HERNIA REPAIR  February 2023    INGUINAL HERNIA REPAIR  February 2023    OTHER SURGICAL HISTORY      colorectal surgery due to precancer cells    THYROIDECTOMY          Social History     Socioeconomic History    Marital status: Single     Spouse name: Not on file    Number of children: Not on file    Years of education: Not on file    Highest education level: Not on file   Occupational History    Not on file   Tobacco Use    Smoking status: Never    Smokeless tobacco: Never   Substance and Sexual  Activity    Alcohol use: Not Currently     Alcohol/week: 1.0 standard drink of alcohol     Types: 1 Glasses of wine per week     Comment: 1 glass of wine per week    Drug use: Never    Sexual activity: Not Currently   Other Topics Concern    Not on file   Social History Narrative    Not on file     Social Determinants of Health     Financial Resource Strain: Not on file   Food Insecurity: Not on file   Transportation Needs: Not on file   Physical Activity: Not on file   Stress: Not on file   Social Connections: Not on file   Intimate Partner Violence: Not on file   Housing Stability: Not on file       Family History  Reviewed and not pertinent     Advance Care Plan  Extended Emergency Contact Information  Primary Emergency Contact: Sciulli,Kimberly  Mobile Phone: [phone]  Relation: Sibling  Preferred language: English  Interpreter needed? No  Prior       Allergies  Patient has no known allergies.     Medications    Current Outpatient Medications:     alfuzosin (Uroxatral) 10 mg 24 hr tablet, Take 1 tablet (10 mg) by mouth once daily., Disp: 30 tablet, Rfl: 1    ketorolac (Acular) 0.5 % ophthalmic solution, Administer 1 drop into the right eye four times daily. Start 1 day before surgery, Disp: 10 mL, Rfl: 3    levothyroxine (Synthroid, Levoxyl) 50 mcg tablet, Take 50 mcg by mouth., Disp: , Rfl:     lotilaner (Xdemvy) 0.25 % drops, Administer 1 drop into affected eye(s) twice daily., Disp: 10 mL, Rfl: 0    ofloxacin (Ocuflox) 0.3 % ophthalmic solution, Administer 1 drop into the right eye four times daily. Start 1 day before surgery (Patient not taking: Reported on 04/05/2022), Disp: 10 mL, Rfl: 3    prednisoLONE acetate (Pred-Forte) 1 % ophthalmic suspension, Administer 1 drop into the right eye four times daily. Start 1 day before surgery (Patient not taking: Reported on 04/05/2022), Disp: 10 mL, Rfl: 3    Prolensa 0.07 % drops, ADMINISTER 1 DROP INTO AFFECTED EYE(S) ONCE DAILY. AFTER SURGERY- NOT COVERED (Patient  not taking: Reported on 04/05/2022), Disp: 9 mL, Rfl: 3    Review of Systems  All other systems reviewed and negative.    Objective     Vitals Signs  Blood pressure 109/69, pulse 75, temperature 36.7 C (98  F), temperature source Oral, weight 74.3 kg, SpO2 98%.    Physical Exam  General Appearance: Well-developed, well nourished, NAD.  HEENT: EOMI.  Neck: Supple, normal ROM.   Lungs: No acute respiratory distress.  Cardiovascular: RRR.   Abdomen: Soft, NT, ND. Bladder is not palpable.   Extremities: Moves all extremities  Neurologic: Oriented 3x with no focal deficits.    GU:   Phallus: Normally developed, no lesions.   Glans: No lesions or inflammation.  Meatus: Orthotopic, no discharge.  Scrotum: No mass or tenderness. No hernias, hydroceles or varicoceles  Epididymis: No masses, tenderness or induration.  Testes: Normal size and consistency. No masses, asymmetry, tenderness or atrophy.  Rectal: Normal sphincter tone. No perineal, anal or rectal lesions.  Prostate: 2+ enlarged, smooth, non-tender and symmetrical, no palpable nodules  Bladder: non-tender and not palpable.       Stone Analysis:   09/17/2020: 80% Uric acid dihydrate  20% Calcium oxalate dihydrate            PSA:   04/01/2016: 1.0 - [facility].  09/14/2020: 1.54 - [facility].  10/27/2021: 1.62 - [facility] [employer].  07/28/2022: 1.6.      LithoLink:   08/02/2022:              Imaging (Reviewed by Dr. Gwenith Daily):  Cystoscopy:  02/21/2021 (Dr. Lucilla Lame - [facility]): Multiple tiny calcifications in the bladder (ie. Milk of magnesia) creating a snow globe appearance. Bladder outlet obstruction.     CT Scan ABD/Pelvis without contrast:  07/28/2022: No hydronephrosis. No radiopaque renal stones. Thin hyperdensity along the anterior bladder wall, representing layering tiny bladder stones versus wall calcification. No significant bladder wall thickening. Prostatomegaly. 4 mm right lower lobe pulmonary nodule. Consider follow-up chest CT in 12  months.        Assessment/Plan:  This is a 62 y.o. male with the following urologic problems:     1. Bladder and kidney stones: Patient passes several bladder stones every few months x 2-3 years. Likely due to bladder outlet obstruction. Patient also with a history of renal colic in 2018 requiring URS/HLL. CTAP on 07/2022 with no renal stones or hydro, layering tiny bladder stones vs calcifications along anterior bladder wall. Litholink from 07/2022 with suboptimal urine volume, hyperuricosuria, and mild acid supersaturation.      Plan:  - Decrease animal proteins, add more citrate to diet.     2. Lower urinary tract symptoms (LUTS): Symptoms include slow urinary stream, incomplete bladder emptying, urinary frequency q 2 hours during the day, urinary urgency and nocturia x 1-2. Currently managed with Alfuzosin 10 mg since 04/2021 with persistent moderate-severe bothersome LUTS. Cystoscopy today with significant intravesical growth and 3+ trabeculation, unable to provide flow.  Discussed outlet obstruction due to large 80gm intravesical prostate and deteriorating bladder function seen on cystoscopy. No bladder stones seen on cysto        Plan:  - Proceed with HoLEP on 11/28/2022 at [facility] with Dr. Gwenith Daily  - Consent signed today in clinic  - Urine culture  - Continue Alfuzosin 10 mg daily.  - PAT as scheduled  - NPO p MN reviewed     I counseled Brian Tucker as to options for ongoing management including observation, combination medical therapy with alpha blocker and 5-AR, catheter usage, or surgery with either HoLEP or open/robotic simple prostatectomy including all risks of each of these options. We discussed pros and cons of the surgery including potential complications such as clot retention,  stress or urge incontinence; stricture. He would like to proceed with HoLEP with the goal of improving his symptoms and improving bladder emptying.?      Today I reviewed holmium laser enucleation of the prostate as a means to  relieve obstruction from benign prostate hypertrophy. Brian Tucker of the procedure was reviewed including potential side effects. I noted that we would strive to remove all of the obstructing prostate adenoma which would give him the best chance possible of durable relief of the obstruction. I noted the low retreatment rate of less than 1% at 10 years.      Side effects were reviewed including but not limited to infection, hematuria that may last several weeks, worsening urgency frequency and incontinence that may take 3 months to improve, permanent retrograde ejaculation, iatrogenic urethral or bladder injury, risk of anesthesia, delayed scar formation requiring additional intervention, and persistent bothersome urinary symptoms that may require additional therapy.      I emphasized the recovery period in which he should push fluids and minimize straining while there is gross hematuria. I noted that he might experience passage of sloughing tissue anywhere from 3-12 weeks after the procedure. I encouraged him to consider physical therapy as a means to maximize pelvic floor strengthening prior to the surgical intervention to that he can have the quickest recovery possible of urinary control.      In the end all of his questions were answered. He wished to proceed with holmium laser enucleation of the prostate.?I specifically discussed potential for persistent and/or worsening storage symptoms and incontinence which may require additional treatment. He wishes to proceed.??       3. Hematospermia: Multiple episodes over the past 2-3 months.  Discussed likely benign etiology.  Plan:  - observation      Rayfield Citizen, PA-C

## 2022-11-15 ENCOUNTER — Ambulatory Visit: Payer: PRIVATE HEALTH INSURANCE | Primary: Internal Medicine

## 2022-11-16 LAB — URINE CULTURE: Urine Culture: NO GROWTH

## 2022-11-17 NOTE — Procedures (Signed)
Nothing to eat after midnight. OK to have water until 4:00 am day of surgery. Tale your levothyroxine and alfuzosin with a small amount of water before coming in    Wear loose, comfortable clothes. Leave your valuables at home (jewelry/wallet)    Brush your teeth before coming in     Arrive at 6:00 am to 3rd floor Surgical Registration.    You will need a reliable adult to take you home if you go home the same day.

## 2022-11-28 LAB — TYPE AND SCREEN
ABORh: A POS
Antibody Screen: NEGATIVE

## 2022-11-28 LAB — CONFIRM ABO/RH: Confirm ABORh: A POS

## 2022-11-28 MED ORDER — haloperidol lactate (Haldol) injection 1 mg
5 | Freq: Once | INTRAMUSCULAR | Status: DC | PRN
Start: 2022-11-28 — End: 2022-11-28

## 2022-11-28 MED ORDER — rocuronium (ZeMuron) 10 mg/mL injection  - Omnicell Override Pull
10 | INTRAVENOUS | Status: AC
Start: 2022-11-28 — End: ?

## 2022-11-28 MED ORDER — lidocaine PF (Xylocaine) 20 mg/mL (2 %) injection
20 | INTRAMUSCULAR | Status: DC | PRN
Start: 2022-11-28 — End: 2022-11-28
  Administered 2022-11-28: 14:00:00 100 via INTRAVENOUS

## 2022-11-28 MED ORDER — lactated Ringer's infusion
INTRAVENOUS | Status: DC | PRN
Start: 2022-11-28 — End: 2022-11-28
  Administered 2022-11-28 (×2): via INTRAVENOUS

## 2022-11-28 MED ORDER — propofol (Diprivan) injection
10 | INTRAVENOUS | Status: DC | PRN
Start: 2022-11-28 — End: 2022-11-28
  Administered 2022-11-28: 14:00:00 200 via INTRAVENOUS
  Administered 2022-11-28: 14:00:00 50 via INTRAVENOUS

## 2022-11-28 MED ORDER — dexMEDEtomidine in NS (Precedex) injection
4 | INTRAVENOUS | Status: DC | PRN
Start: 2022-11-28 — End: 2022-11-28
  Administered 2022-11-28: 14:00:00 .2 via INTRAVENOUS

## 2022-11-28 MED ORDER — propofol (Diprivan) 10 mg/mL injection  - Omnicell Override Pull
10 | INTRAVENOUS | Status: AC
Start: 2022-11-28 — End: ?

## 2022-11-28 MED ORDER — ceFAZolin (Ancef) 2 g in sodium chloride 0.9 % 100 mL IVPB-MBP
2 | Freq: Once | INTRAMUSCULAR | Status: AC
Start: 2022-11-28 — End: 2022-11-28
  Administered 2022-11-28: 14:00:00 2 g via INTRAVENOUS

## 2022-11-28 MED ORDER — ondansetron (Zofran) injection 4 mg
4 | INTRAMUSCULAR | Status: DC | PRN
Start: 2022-11-28 — End: 2022-11-28

## 2022-11-28 MED ORDER — ondansetron (Zofran) 4 mg/2 mL injection  - Omnicell Override Pull
4 | INTRAMUSCULAR | Status: AC
Start: 2022-11-28 — End: ?

## 2022-11-28 MED ORDER — fentaNYL (Sublimaze) injection
50 | INTRAMUSCULAR | Status: DC | PRN
Start: 2022-11-28 — End: 2022-11-28
  Administered 2022-11-28: 14:00:00 100 via INTRAVENOUS
  Administered 2022-11-28: 14:00:00 50 via INTRAVENOUS

## 2022-11-28 MED ORDER — oxyCODONE (Roxicodone) immediate release tablet 10 mg
5 | Freq: Once | ORAL | Status: DC | PRN
Start: 2022-11-28 — End: 2022-11-28

## 2022-11-28 MED ORDER — lidocaine PF (Xylocaine) 20 mg/mL (2 %) injection  - Omnicell Override Pull
20 | INTRAMUSCULAR | Status: AC
Start: 2022-11-28 — End: ?

## 2022-11-28 MED ORDER — sugammadex (Bridion) injection
100 | INTRAVENOUS | Status: DC | PRN
Start: 2022-11-28 — End: 2022-11-28
  Administered 2022-11-28: 15:00:00 200 via INTRAVENOUS

## 2022-11-28 MED ORDER — HYDROmorphone (Dilaudid) injection 0.2 mg
0.5 | INTRAMUSCULAR | Status: DC | PRN
Start: 2022-11-28 — End: 2022-11-28

## 2022-11-28 MED ORDER — fentaNYL (Sublimaze) injection 25 mcg
50 | INTRAMUSCULAR | Status: DC | PRN
Start: 2022-11-28 — End: 2022-11-28

## 2022-11-28 MED ORDER — dexAMETHasone (Decadron) 4 mg/mL injection  - Omnicell Override Pull
4 | INTRAMUSCULAR | Status: AC
Start: 2022-11-28 — End: ?

## 2022-11-28 MED ORDER — oxyCODONE (Roxicodone) immediate release tablet 5 mg
5 | Freq: Once | ORAL | Status: DC | PRN
Start: 2022-11-28 — End: 2022-11-28

## 2022-11-28 MED ORDER — rocuronium (ZeMuron) injection
10 | INTRAVENOUS | Status: DC | PRN
Start: 2022-11-28 — End: 2022-11-28
  Administered 2022-11-28 (×2): 10 via INTRAVENOUS
  Administered 2022-11-28: 14:00:00 70 via INTRAVENOUS
  Administered 2022-11-28: 14:00:00 10 via INTRAVENOUS

## 2022-11-28 MED ORDER — midazolam (Versed) injection
1 | INTRAMUSCULAR | Status: DC | PRN
Start: 2022-11-28 — End: 2022-11-28
  Administered 2022-11-28: 13:00:00 2 via INTRAVENOUS

## 2022-11-28 MED ORDER — acetaminophen (Ofirmev) injection
1000 | INTRAVENOUS | Status: DC | PRN
Start: 2022-11-28 — End: 2022-11-28
  Administered 2022-11-28: 14:00:00 1000 via INTRAVENOUS

## 2022-11-28 MED ORDER — ePHEDrine injection
50 | INTRAVENOUS | Status: DC | PRN
Start: 2022-11-28 — End: 2022-11-28
  Administered 2022-11-28 (×3): 5 via INTRAVENOUS

## 2022-11-28 MED ORDER — dexMEDEtomidine in NS (Precedex) 4 mcg/mL injection  - Omnicell Override Pull
4 | INTRAVENOUS | Status: AC
Start: 2022-11-28 — End: ?

## 2022-11-28 MED ORDER — dexAMETHasone (Decadron) injection
4 | INTRAMUSCULAR | Status: DC | PRN
Start: 2022-11-28 — End: 2022-11-28
  Administered 2022-11-28: 14:00:00 4 via INTRAVENOUS

## 2022-11-28 MED ORDER — fentaNYL (Sublimaze) 50 mcg/mL injection  - Omnicell Override Pull
50 | INTRAMUSCULAR | Status: AC
Start: 2022-11-28 — End: ?

## 2022-11-28 MED ORDER — acetaminophen (Ofirmev) 1,000 mg/100 mL (10 mg/mL) injection  - Omnicell Override Pull
1000 | INTRAVENOUS | Status: AC
Start: 2022-11-28 — End: ?

## 2022-11-28 MED ORDER — ondansetron (Zofran) injection
4 | INTRAMUSCULAR | Status: DC | PRN
Start: 2022-11-28 — End: 2022-11-28
  Administered 2022-11-28: 14:00:00 4 via INTRAVENOUS

## 2022-11-28 MED ORDER — sugammadex (Bridion) 100 mg/mL injection  - Omnicell Override Pull
100 | INTRAVENOUS | Status: AC
Start: 2022-11-28 — End: ?

## 2022-11-28 MED ORDER — midazolam (Versed) 1 mg/mL injection  - Omnicell Override Pull
1 | INTRAMUSCULAR | Status: AC
Start: 2022-11-28 — End: ?

## 2022-11-28 MED ORDER — phenylephrine (Neo-Synephrine) 10 mg/mL injection  - Omnicell Override Pull
10 | INTRAMUSCULAR | Status: AC
Start: 2022-11-28 — End: ?

## 2022-11-28 MED FILL — ACETAMINOPHEN 1,000 MG/100 ML (10 MG/ML) INTRAVENOUS SOLUTION: 1000 1,000 mg/100 mL (10 mg/mL) | INTRAVENOUS | Qty: 100

## 2022-11-28 MED FILL — CEFAZOLIN IVPB 2 G IN 100 ML NS (MINI-BAG PLUS): 2.0000 2.0000 g | Qty: 2000

## 2022-11-28 MED FILL — FENTANYL (PF) 50 MCG/ML INJECTION SOLUTION: 50 50 mcg/mL | INTRAMUSCULAR | Qty: 5

## 2022-11-28 MED FILL — SUGAMMADEX 100 MG/ML INTRAVENOUS SOLUTION: 100 100 mg/mL | INTRAVENOUS | Qty: 2

## 2022-11-28 MED FILL — PROPOFOL 10 MG/ML INTRAVENOUS EMULSION: 10 10 mg/mL | INTRAVENOUS | Qty: 20

## 2022-11-28 MED FILL — MIDAZOLAM 1 MG/ML INJECTION SOLUTION WRAPPER: 1 1 mg/mL | INTRAMUSCULAR | Qty: 2

## 2022-11-28 MED FILL — ONDANSETRON HCL (PF) 4 MG/2 ML INJECTION SOLUTION: 4 4 mg/2 mL | INTRAMUSCULAR | Qty: 2

## 2022-11-28 MED FILL — ROCURONIUM 10 MG/ML INTRAVENOUS SOLUTION: 10 10 mg/mL | INTRAVENOUS | Qty: 5

## 2022-11-28 MED FILL — PHENYLEPHRINE 10 MG/ML INJECTION SOLUTION: 10 10 mg/mL | INTRAMUSCULAR | Qty: 2

## 2022-11-28 MED FILL — DEXMEDETOMIDINE 80 MCG/20 ML (4 MCG/ML) IN 0.9 % SODIUM CHLORIDE IV: 4 4 mcg/mL | INTRAVENOUS | Qty: 20

## 2022-11-28 MED FILL — LIDOCAINE (PF) 20 MG/ML (2 %) INJECTION WRAPPER: 20 20 mg/mL (2 %) | INTRAMUSCULAR | Qty: 5

## 2022-11-28 MED FILL — DEXAMETHASONE SODIUM PHOSPHATE 4 MG/ML INJECTION SOLUTION: 4 4 mg/mL | INTRAMUSCULAR | Qty: 1

## 2022-11-28 NOTE — Telephone Encounter (Signed)
Scheduled rn visit for 9/19 at 9:30 AM and f/u with Dr Gwenith Daily 12/18 at 9:45 AM

## 2022-11-28 NOTE — Op Note (Signed)
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 Yes 11/28/22 1149   Is the bag not touching floor? Yes 11/28/22 1149   Is this a Closed System (seal intact)? Yes 11/28/22 1149   Is the bag below the bladder? Yes 11/28/22 1149   Rate Fast 11/28/22 1149   Irrigant Normal saline 11/28/22 1149      Specimens       ID Source Type Tests Collected By Collected At Frozen? Priority Lab ID    1 Prostate Tissue TISSUE PATHOLOGY   Derald Macleod, MD 11/28/22 1119       Description: Prostatic Chips 35g           Staff:   Circulator: Mosetta Pigeon, RN  Relief Circulator: Herma Mering, RN  Scrub Person: Milmann Admettre    PREOPERATIVE DIAGNOSIS:  BPH with bladder outlet obstruction   POSTOPERATIVE DIAGNOSIS:  Same     OPERATIONS:  1. Holmium Laser Enucleation of the Prostatexy     SURGEON: Derald Macleod, MD     ANESTHESIA: General  DRAINS: 66fr 3-way foley, 40ccs balloon  SPECIMENS: prostate chips  ESTIMATED BLOOD LOSS: 20 cc     FINDINGS:  1. Enucleation time: 37 minutes  2. Morcellation time: 5 minutes  3. Resection volume: 32 grams  4. Verumontanum, external sphincter and ureteral orifices intact at  conclusion of case.     INDICATIONS:    Brian Tucker is a 62 y.o. male with BPH with bladder outlet obstruction. The patient was not dependent on an indwelling foley at the time of the procedure.  Pre-operative imaging with CT demonstrated estimated prostate size of 60gm. He has elected to undergo a HOLEP.     DETAILS OF THE PROCEDURE:   After obtaining written consent, the patient was brought to the operating room, where a timeout was performed to confirm the correct patient, procedure, and laterality. Preoperative antibiotics were administered and SCDs were used for DVT prophylaxis. General anesthesia was then induced without complication. We then positioned the patient in low lithotomy position and prepped and draped him in the usual sterile fashion.  The meatus, fossa and bulbar urethra were sequentially dilated with male sounds to 63fr. A 30fr continuous flow resectoscope was inserted into the bladder under direct vision using the visual obturator. The bladder was fully inspected showing moderate-severe bladder trabeculation, no lesions, and numerous fine uric acid appearing stone dust that was siphoned out through the resectoscope. The ureteral orifices were located in their orthotopic positions.    The visual obturator was switched for the laser bridge with a 500  micron Holmium laser inserted.  An early apical release was performed at lower Watt settings of 1J and 30Hz  making a circumferential incision just beyond the external sphincter and sparing the verumontanum.  An en-bloc enucleation technique was employed. The laser was set to 2J and 50 Hertz and was the lateral lobes were then incised circumferentially until they were disconnected from the surrounding tissue.  The capsule was examined and the laser was used for hemostasis at settings of 1J and 30Hz .  The 55fr resectoscope was switched out for the 31fr nephroscope and the lobes were morcellated and the tissue sent to pathology.  Further hemostasis with  the laser on coagulation settings was obtained with excellent hemostasis achieved under low flow conditions.     A 2fr 3-way catheter was inserted and CBI was initiated with urine clear light pink. 40ml sterile water was placed in the balloon.      The patient tolerated the procedure without difficulty. He was awakened and extubated in the operating room prior to being transferred to the PACU in stable condition.     Derald Macleod, MD was present for the entirety of the case.    Complications:  None; patient tolerated the procedure well.     Disposition: PACU - hemodynamically stable.  Condition: stable        Derald Macleod  Phone Number: [phone]

## 2022-11-28 NOTE — Other (Signed)
Patient Education  Table of Contents   General Anesthesia, Adult, Care After    To view videos and all your education online visit,  [email or web address].elsevier.[email or web address]  or scan this QR code with your smartphone.  Access to this content will expire in one year.  General Anesthesia, Adult, Care After  The following information offers guidance on how to care for yourself after your procedure. Your health care provider may also give you more specific instructions. If you have problems or questions, contact your health care provider.  What can I expect after the procedure?  After the procedure, it is common for people to:   Have pain or discomfort at the IV site.   Have nausea or vomiting.   Have a sore throat or hoarseness.   Have trouble concentrating.   Feel cold or chills.   Feel weak, sleepy, or tired (fatigue).   Have soreness and body aches. These can affect parts of the body that were not involved in surgery.  Follow these instructions at home:  For the time period you were told by your health care provider:     Rest.    Do not participate in activities where you could fall or become injured.   Do not drive or use machinery.   Do not drink alcohol.    Do not take sleeping pills or medicines that cause drowsiness.    Do not make important decisions or sign legal documents.    Do not take care of children on your own.  General instructions   Drink enough fluid to keep your urine pale yellow.   If you have sleep apnea, surgery and certain medicines can increase your risk for breathing problems. Follow instructions from your health care provider about wearing your sleep device:   ? Anytime you are sleeping, including during daytime naps.  ? While taking prescription pain medicines, sleeping medicines, or medicines that make you drowsy.   Return to your normal activities as told by your health care provider. Ask your health care provider what activities are safe for you.   Take over-the-counter and prescription  medicines only as told by your health care provider.   Do not use any products that contain nicotine or tobacco. These products include cigarettes, chewing tobacco, and vaping devices, such as e-cigarettes. These can delay incision healing after surgery. If you need help quitting, ask your health care provider.  Contact a health care provider if:   You have nausea or vomiting that does not get better with medicine.   You vomit every time you eat or drink.   You have pain that does not get better with medicine.   You cannot urinate or have bloody urine.   You develop a skin rash.   You have a fever.  Get help right away if:   You have trouble breathing.   You have chest pain.   You vomit blood.  These symptoms may be an emergency. Get help right away. Call 911.   Do not wait to see if the symptoms will go away.   Do not drive yourself to the hospital.  Summary   After the procedure, it is common to have a sore throat, hoarseness, nausea, vomiting, or to feel weak, sleepy, or fatigue.   For the time period you were told by your health care provider, do not drive or use machinery.   Get help right away if you have difficulty breathing, have chest pain, or vomit blood. These symptoms may  be an emergency.  This information is not intended to replace advice given to you by your health care provider. Make sure you discuss any questions you have with your health care provider.  Document Released: 2000-06-05 Document Updated: 2021-05-27 Document Reviewed: 2021-05-27  Elsevier Patient Education ? 2024 Elsevier Inc.

## 2022-11-28 NOTE — Anesthesia Post-Procedure Evaluation (Signed)
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    Anesthesia Post Evaluation Note:    Patient location during evaluation: PACU  Patient participation: able to participate  Level of consciousness: arousable  Cardiovascular and Hydration status: stable  Respiratory Status Stable and Airway Patent: yes  Nausea and Vomiting Control Satisfactory: yes  Pain score: 2  Pain management: adequate     Aldrete score reviewed: yes  Vitals reviewed: yes  Unplanned ICU Admission: noPatient has recovered from anesthesia and has returned to baseline mental status, cardiovascular and respiratory function. Pain, nausea, and vomiting are adequately controlled and the patient is adequately hydrated and appropriate for discharge from PACU?: yes      No notable events documented.

## 2022-11-28 NOTE — Telephone Encounter (Signed)
Please put patient on RN schedule Thursday 11/30/22 at 0930 for VT.     Please call patient to schedule 3 month post op HoLEP follow up with Dr. Gwenith Daily

## 2022-11-28 NOTE — Other (Signed)
Patient Education  Table of Contents   General Anesthesia, Adult, Care After    To view videos and all your education online visit,  [email or web address].elsevier.com/[email or web address]  or scan this QR code with your smartphone.  Access to this content will expire in one year.  General Anesthesia, Adult, Care After  The following information offers guidance on how to care for yourself after your procedure. Your health care provider may also give you more specific instructions. If you have problems or questions, contact your health care provider.  What can I expect after the procedure?  After the procedure, it is common for people to:   Have pain or discomfort at the IV site.   Have nausea or vomiting.   Have a sore throat or hoarseness.   Have trouble concentrating.   Feel cold or chills.   Feel weak, sleepy, or tired (fatigue).   Have soreness and body aches. These can affect parts of the body that were not involved in surgery.  Follow these instructions at home:  For the time period you were told by your health care provider:     Rest.    Do not participate in activities where you could fall or become injured.   Do not drive or use machinery.   Do not drink alcohol.    Do not take sleeping pills or medicines that cause drowsiness.    Do not make important decisions or sign legal documents.    Do not take care of children on your own.  General instructions   Drink enough fluid to keep your urine pale yellow.   If you have sleep apnea, surgery and certain medicines can increase your risk for breathing problems. Follow instructions from your health care provider about wearing your sleep device:   ? Anytime you are sleeping, including during daytime naps.  ? While taking prescription pain medicines, sleeping medicines, or medicines that make you drowsy.   Return to your normal activities as told by your health care provider. Ask your health care provider what activities are safe for you.   Take over-the-counter and prescription  medicines only as told by your health care provider.   Do not use any products that contain nicotine or tobacco. These products include cigarettes, chewing tobacco, and vaping devices, such as e-cigarettes. These can delay incision healing after surgery. If you need help quitting, ask your health care provider.  Contact a health care provider if:   You have nausea or vomiting that does not get better with medicine.   You vomit every time you eat or drink.   You have pain that does not get better with medicine.   You cannot urinate or have bloody urine.   You develop a skin rash.   You have a fever.  Get help right away if:   You have trouble breathing.   You have chest pain.   You vomit blood.  These symptoms may be an emergency. Get help right away. Call 911.   Do not wait to see if the symptoms will go away.   Do not drive yourself to the hospital.  Summary   After the procedure, it is common to have a sore throat, hoarseness, nausea, vomiting, or to feel weak, sleepy, or fatigue.   For the time period you were told by your health care provider, do not drive or use machinery.   Get help right away if you have difficulty breathing, have chest pain, or vomit blood. These symptoms may  be an emergency.  This information is not intended to replace advice given to you by your health care provider. Make sure you discuss any questions you have with your health care provider.  Document Released: 2000-06-05 Document Updated: 2021-05-27 Document Reviewed: 2021-05-27  Elsevier Patient Education ? 2024 Elsevier Inc.

## 2022-11-28 NOTE — Anesthesia Pre-Procedure Evaluation (Addendum)
Patient: Brian Tucker    Procedure Information       Date/Time: 11/28/22 0730    Procedure: HoLEP - PHONE OK / 23 HR. / BED    Location: [facility] OR 06 / [facility] Operating Room    Surgeons: Derald Macleod, MD            Relevant Problems   Anesthesia (within normal limits)      Cardio   (-) CAD (coronary artery disease) (CMS-HCC)   (-) CHF (congestive heart failure) (Multi-HCC)      Pulmonary   (-) Asthma (Multi-HCC)   (-) COPD (chronic obstructive pulmonary disease) (Multi-HCC)   (-) OSA (obstructive sleep apnea)      GU/Renal   (+) Kidney stones      Endo   (+) Hypothyroidism (CMS-HCC)      Neuro/Psych   (-) CVA (cerebral vascular accident) (Multi-HCC)   (-) Seizures (Multi-HCC)      Genitourinary   (+) Benign prostatic hyperplasia with urinary obstruction       Clinical information reviewed:   Tobacco  Allergies  Meds   Med Hx  Surg Hx   Fam Hx  Soc Hx         Physical Exam    Airway  Mallampati: I  TM distance: >3 FB  Mouth opening: >3 FB  Neck ROM: full     Cardiovascular   Rhythm: regular  Rate: normal  Functional capacity:  greater than or equal to 4 METS without symptoms   Dental - normal exam     Pulmonary - normal exam     Abdominal - normal exam     General   Alert                   Anesthesia Plan    ASA 2   NPO status verified    general     Airway: LMA  Monitoring: standard monitors  Postoperative Pain Control: IV/PO analgesics    Essential imaging and labs available and reviewed    Anesthetic plan and risks discussed with patient.  Use of blood products discussed with patient  Consented to blood products

## 2022-11-28 NOTE — Interval H&P Note (Signed)
H&P reviewed. The patient was examined and there are no changes to the H&P.    OR for HoLEP

## 2022-11-28 NOTE — Discharge Summary (Signed)
Discharge Diagnosis  BPH with urinary obstruction    Hospital Course  Brian Tucker is a 62 y.o. male who underwent a HoLEP. The patient had uneventful intra-operative course, and vitals were stable. The patient tolerated the procedure well.     Test Results Pending At Discharge  Pending Labs       Order Current Status    Tissue Pathology Collected (11/28/22 1119)            Pertinent Physical Exam At Time of Discharge  General: NAD    Cardiovascular: RRR.    Respiratory: No acute respiratory distress.     Genitourinary: 3 way Foley catheter with clear, yellow urine.    Neurological: Alert and oriented.     Psych: Appropriate behavior.    Issues requiring follow up:  Fever greater than 100.4 F (or 38 C), chills, nausea, vomiting, pain, no urine output, cloudiness or increased bleeding/blood clots in your urine/catheter (if applicable), distended abdomen or palpable bladder, or bladder fullness    Please call the Urology office at [phone] and select the prompt to reach the appropriate urology PA on call [facility]).    Follow up:  VT GU RN 11/30/22 at 0930AM  3 mo follow up with Dr. Gwenith Daily      [facility]- [facility]  [address]  [address]  [place], [place] [address]

## 2022-11-28 NOTE — Other (Signed)
Patient Education  Table of Contents   Indwelling Urinary Catheter Insertion, Care After    To view videos and all your education online visit,  [email or web address].elsevier.com/[email or web address]  or scan this QR code with your smartphone.  Access to this content will expire in one year.  Indwelling Urinary Catheter Insertion, Care After  This sheet gives you information about how to care for yourself after your procedure. Your health care provider may also give you more specific instructions. If you have problems or questions, contact your health care provider.  What can I expect after the procedure?  After the procedure, it is common to have:   Slight discomfort around your urethra where the catheter enters your body.  Follow these instructions at home:  General instructions   Keep the drainage bag at or below the level of your bladder. By doing this, your urine can only drain out instead of going back into your body.   Secure the catheter tubing and drainage bag to your leg or thigh to keep it from moving.   Check the catheter tubing regularly to make sure there are no kinks or blockages.   Take showers daily to keep the catheter clean. Do not take a bath.   Do not pull on your catheter.   Disconnect the tubing and drainage bag as little as possible.   Empty the drainage bag every 2?4 hours, or more often if needed. Do not let the bag get completely full.   Wash your hands with soap and water before and after touching the catheter, tubing, or drainage bag.   Do not let the drainage bag or catheter tubing touch the floor.   Drink enough fluids to keep your urine pale yellow, or as told by your health care provider.  How to remove the catheter    Remove the catheter only if told by your health care provider. Follow instructions from your health care provider about when and how to remove the catheter. For most catheters, you will need to take the following steps:  1. Prepare your supplies. You will need a:   Syringe. This would be  given to you by your health care provider.   Towel.   Wastebasket.  Empty the drainage bag if needed.  Wash your hands with soap and warm water.  Remove the tape that secures the catheter to your leg or thigh.  Get into a comfortable position, such as:   Lying down with your head raised on pillows and your knees pointing to the ceiling.   Sitting on a chair or the edge of a bed.  Place the towel under you to catch any spilled urine.  Put the syringe into the balloon port of the catheter. Use a firm push and twist motion to fit the syringe into the balloon port.  The water from the balloon will empty into the syringe.  Gently pull out the catheter once the balloon is empty.   If the catheter doesn't slide easily, do not use force. Let your health care provider know that you are not able to remove the catheter.  Throw the used catheter and the syringe in the wastebasket.  Wipe any spilled urine or water with the towel.  Wash your hands with soap and warm water.  Safety  Let your health care provider know if:   Your bladder is full, but you are not able to urinate.   You have removed the catheter, but you are not able to urinate after  8 hours.  Contact a health care provider if:   Your urine:  ? Looks cloudy.  ? Has a bad smell.  ? Stops flowing into the drainage bag.   Your catheter:  ? Gets clogged.  ? Starts to leak.   You feel pain or pressure in the bladder area.   You have back pain.   Your drainage bag or tubing looks dirty.  Get help right away if:   You have a fever or chills.   You have severe pain in your back or your lower abdomen.   You have warmth, redness, swelling, or pain in the urethra area.   You notice blood in your urine.   Your catheter gets pulled out.  Summary   Wash your hands with soap and water before and after touching the catheter, tubing, or drainage bag.   Do not pull on your catheter or try to remove it.   Keep the drainage bag at or below the level of your bladder, but do not let the  drainage bag or catheter tubing touch the floor.   Get help right away if you have a fever, chills, or any other signs of infection.  This information is not intended to replace advice given to you by your health care provider. Make sure you discuss any questions you have with your health care provider.  Document Released: 2016-04-08 Document Updated: 2020-05-19 Document Reviewed: 2020-02-13  Elsevier Patient Education ? 2022 Elsevier Inc.

## 2022-11-28 NOTE — Other (Signed)
Patient Education  Table of Contents   Indwelling Urinary Catheter Bag Care, Adult    To view videos and all your education online visit,  https://pe.elsevier.com/ylNMP8Mo  or scan this QR code with your smartphone.  Access to this content will expire in one year.  Indwelling Urinary Catheter Bag Care, Adult    An indwelling urinary catheter is a soft tube that is placed into the bladder to help drain pee (urine) out of the body. The catheter is put in through the urethra and is held inside your bladder by a balloon filled with water. The urethra is the part of the body that drains pee from the bladder.  Pee drains from the catheter into a drainage bag outside of the body. Taking good care of your catheter, catheter tubing, and the drainage bag will keep your catheter working well. It will also help prevent problems like urinary tract infections (UTIs).  What are the risks?   Germs (bacteria) may get into your bladder and cause a UTI.   The flow of pee can become blocked. This can happen if:  ? The catheter is not connected to the drainage bag correctly.  ? You have sediment or a blood clot in your bladder or catheter.  How to wear the catheter and drainage bag  Supplies needed:   Adhesive tape or a leg strap.   If you use tape you will need:  ? Alcohol wipes or soap and water.  ? A clean towel.   Overnight drainage bag.   Smaller drainage bag (leg bag).  Wearing your catheter and bag    Use tape or a leg strap to attach the catheter and tubing to your leg.   Make sure the catheter is not pulled tight.   If a leg strap gets wet, replace it with a dry one.   If you use tape:  1.  Use an alcohol wipe or soap and water to wash off any stickiness on your skin where you had tape before.   Use a clean towel to pat-dry the area.   Apply the new tape.  You should have received a large overnight drainage bag and a smaller leg bag that fits under clothing.   You may wear the overnight bag at any time, but you should not wear  the leg bag at night.   Make sure the overnight drainage bag is always lower than the level of your bladder. But do not let the bag touch the floor.   Before you go to sleep, hang the bag on the side of a wastebasket that is covered by a clean plastic bag.   Secure the leg bag according to the manufacturer's instructions. This may be above or below the knee, depending on the length of the tubing. Make sure that:  ? The leg bag is below your bladder.  ? The tubing does not have loops or too much tension.  How to empty the drainage bag  Supplies needed:   Rubbing alcohol or alcohol wipes.   Gauze pad or cotton ball.   Toilet or a clean container.  Emptying the bag  Empty your drainage bag when it is ?-? full, or at least 2?3 times a day. Clean the bag according to the manufacturer's instructions or as told by your health care provider.  1. Wash your hands with soap and water for at least 20 seconds. If soap and water are not available, use hand sanitizer.  Hold the drainage bag  over the toilet or the clean container used with the drainage bag. Make sure the drainage bag is lower than your hips and bladder. This stops pee from going back into the tubing and into your bladder.  Open the pour spout at the bottom of the bag.  Empty the pee into the toilet or container. Do not let the pour spout touch any surface. This prevents germs from getting in the bag and causing infection.  Use the gauze pad or cotton ball soaked with rubbing alcohol or an alcohol wipe to clean the pour spout.  Close the pour spout.  Wash your hands again with soap and water for at least 20 seconds.  How to change the drainage bag  Supplies needed:   Alcohol wipes.   A new drainage bag.   Adhesive tape or a leg strap.  Changing the bag  Change your drainage bag twice a day. Also replace your drainage bag with a new bag if it leaks, starts to smell bad, or looks dirty. Be sure to empty your drainage bag before you change bags.  1. Wash your hands  with soap and water for at least 20 seconds. If soap and water are not available, use hand sanitizer.  Detach the tubing or drainage bag from your skin.  Pinch the catheter with your fingers so that pee does not spill out.  Disconnect the catheter from the drainage bag tubing at the connection valve. Do not let the end of the catheter touch any surface.  Use an alcohol wipe to clean the end of the catheter and the end of the drainage bag tubing being connected.  Connect the catheter to the drainage bag tubing.  Attach the bag and tubing to your leg with tape or a leg strap. Avoid attaching the bag too tightly.  Wash your hands again with soap and water for at least 20 seconds.  General instructions     Always wash your hands for at least 20 seconds before and after you handle your catheter or drainage bag. Use a mild, fragrance-free soap.   Never pull on your catheter or try to remove it. Pulling can damage your internal tissues.   Always make sure there are no leaks around the catheter or from the drainage bag and tubing.   Always make sure there are no twists, bends, or kinks in the catheter or the tubing.   Drink enough fluid to keep your pee pale yellow.   Do not take baths, swim, or use a hot tub.   If you are male, wipe from front to back after having a bowel movement.  Contact a health care provider if:   Your catheter starts to leak.   Your bladder feels full.   You have tiredness (lethargy), excessive sleepiness, or confusion.   You have signs of a UTI, such as:  ? A fever or chills.  ? Pain in your abdomen, legs, lower back, or bladder.  Get help right away if:   Your pee is pink or red or you see blood in the catheter.   Your pee is not draining into the bag.   Your catheter gets pulled out.  This information is not intended to replace advice given to you by your health care provider. Make sure you discuss any questions you have with your health care provider.  Document Released: 2021-12-16 Document  Updated: 2022-03-21 Document Reviewed: 2021-12-16  Elsevier Patient Education ? 2024 Elsevier Inc.

## 2022-11-28 NOTE — Anesthesia Procedure Notes (Signed)
Airway    Date/Time: 11/28/2022 9:35 AM    Performed by: Edmonia James, CRNA  Authorized by: Ephriam Knuckles, MD    Location:  OR  Urgency:  Elective  Difficult Airway: No    Induction Technique:  Intravenous  RSI: No    Anesthesiologist:  Ephriam Knuckles, MD  Resident/CRNA:  Edmonia James, CRNA  Performed by:  Resident/CRNA   Ephriam Knuckles, MD  Indications for Airway Management:  Anesthesia  Preoxygenated: Yes    Patient Position:  Sniffing  Mask Ventilation:  Easy mask  Final Airway Type:  Endotracheal airway  Final Endotracheal Airway:  ETT  Cuffed: Yes    Stylet: Yes    Technique Used for Successful ETT Placement:  Video laryngoscopy  Insertion Site:  Oral  Blade Type:  McGrath  Laryngoscope Blade/Video laryngoscope Blade Size:  3  ETT Size (mm):  7.5   Ephriam Knuckles, MD  Measured from:  Teeth  ETT Depth (cm):  21  Placement Verified by: auscultation and capnometry    Cormack-Lehane Classification:  Grade I - full view of glottis  Number of Attempts at Approach:  1   atraumatic

## 2022-11-28 NOTE — Addendum Note (Signed)
Addendum  created 11/28/22 1353 by Edmonia James, CRNA    Narcotic reconciliation edited

## 2022-11-28 NOTE — Discharge Instructions (Addendum)
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 A laser is used to cut and remove the bulky prostate tissue that is blocking your urine flow. A telescope is then used to look inside the bladder and using cauterization, bleeding from around the prostate is stopped. Following the procedure, a Foley catheter was placed into your bladder to help the urine drain. You tolerated the surgery well.     ACTIVITY RESTRICTIONS:  If you have a Foley catheter placed, do not lift more than 10 pounds until the Foley catheter has been removed.    WHAT TO EXPECT WITH RECOVERY:  You can expect to see leakage and blood in your urine. This is normal and to be expected. If it is still clear (think pink lemonade or cherry Kool-aid), this is okay. If it is thick/opaque looking (think rich red wine) and there are large blood clots, please see below how to get in touch with [place]. Please wear a light pad in your underwear to prevent staining. You may also have some burning with urination which should improve over the next few days.     The most important thing that you can to aide in your recovery and prevent bleeding, is prevent constipation. Please do not push or strain to move your bowels. Please eat plenty of high-fiber foods (plant-based), and take stool softeners if needed.     WHEN TO CONTACT YOUR SURGEON:  Fever greater than 100.4 F (or 38 C), chills, nausea, vomiting, pain, no urine output, cloudiness or increased bleeding/blood clots in your urine/catheter (if applicable), distended abdomen or palpable bladder, or bladder fullness    Please call the Urology office at [phone] and select the prompt to reach the appropriate urology PA on call [facility]).    PLAN:  Foley catheter removal on Thursday 11/30/22 at 0930AM with GU RN Lanora Manis)  We will call to schedule a 3 month post op follow up with Dr. Gwenith Daily    [facility]  [facility]  [address]  [address]  [place], [place] [address]

## 2022-11-29 NOTE — Telephone Encounter (Signed)
Patient called. Patient had surgery yesterday with Dr. Gwenith Daily. Patient said he was doing well yesterday but today is experiencing blood coming from the catheter. Patient noted that he isn't in any pain but found this troubling as he's never had this issue before. Patient has RN appt tomorrow for V/T in the morning but is trying to see if he can speak with PA beforehand. Patient CB [phone]

## 2022-11-29 NOTE — Telephone Encounter (Signed)
SWP.     Urine was pink rose wine when he left hospital. Today is lighter. However, had blood per urethra and foley. No pain. Discussed can be expected. Also reviewed VT and plans. Can expect hematuria once foley is out thus he will need to stay well hydrated and avoid constipation.

## 2022-11-30 ENCOUNTER — Ambulatory Visit
Admit: 2022-11-30 | Discharge: 2022-11-30 | Payer: PRIVATE HEALTH INSURANCE | Attending: Student in an Organized Health Care Education/Training Program | Primary: Internal Medicine

## 2022-11-30 DIAGNOSIS — N138 Other obstructive and reflux uropathy: Secondary | ICD-10-CM

## 2022-11-30 NOTE — Progress Notes (Signed)
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 Presenting for voiding trial. Urine has been clear light pink at home.  No pain.    200ccs sterile saline instilled into bladder and foley catheter removed.  Voided all 200ccs clear light pink with PVR of 0ccs  -Voiding trial passed  - Kegels explained and encouraged.  - RTC 3 mo

## 2022-12-03 ENCOUNTER — Other Ambulatory Visit: Payer: Self-pay | Admitting: Internal Medicine

## 2022-12-04 LAB — TISSUE PATHOLOGY

## 2022-12-05 NOTE — Telephone Encounter (Signed)
Requested medication (s) are due for refill today: Yes  Requested medication (s) are on the active medication list: Yes  Last refill:  09/01/22  Future visit scheduled: No  Notes to clinic:  Left message to call and make appointment.    Requested Prescriptions  Pending Prescriptions Disp Refills   atorvastatin (LIPITOR) 20 MG tablet [Pharmacy Med Name: ATORVASTATIN 20MG  TABLETS] 90 tablet 0    Sig: TAKE 1 TABLET(20 MG) BY MOUTH DAILY     Cardiovascular:  Antilipid - Statins Failed - 12/03/2022  3:35 AM      Failed - Valid encounter within last 12 months    Recent Outpatient Visits           1 year ago Encounter for general adult medical examination with abnormal findings   [facility] [place], [place], NP              Failed - Lipid Panel in normal range within the last 12 months    Cholesterol  Date Value Ref Range Status  03/08/2022 194 <200 mg/dL Final   LDL Cholesterol (Calc)  Date Value Ref Range Status  03/08/2022 128 (H) mg/dL (calc) Final    Comment:    Reference range: <100 . Desirable range <100 mg/dL for primary prevention;   <70 mg/dL for patients with CHD or diabetic patients  with > or = 2 CHD risk factors. Marland Kitchen LDL-C is now calculated using the Martin-Hopkins  calculation, which is a validated novel method providing  better accuracy than the Friedewald equation in the  estimation of LDL-C.  Horald Pollen et al. Lenox Ahr. 4034;742(59): 2061-2068  (http://education.Quest[employer].com/faq/FAQ164)    Direct LDL  Date Value Ref Range Status  05/25/2020 159.0 mg/dL Final    Comment:    Optimal:  <100 mg/dLNear or Above Optimal:  100-129 mg/dLBorderline High:  130-159 mg/dLHigh:  160-189 mg/dLVery High:  >190 mg/dL   HDL  Date Value Ref Range Status  03/08/2022 46 > OR = 40 mg/dL Final   Triglycerides  Date Value Ref Range Status  03/08/2022 98 <150 mg/dL Final         Passed - Patient is not pregnant

## 2022-12-06 ENCOUNTER — Encounter: Payer: Self-pay | Admitting: Internal Medicine

## 2022-12-06 ENCOUNTER — Ambulatory Visit (INDEPENDENT_AMBULATORY_CARE_PROVIDER_SITE_OTHER): Payer: BC Managed Care – PPO | Admitting: Internal Medicine

## 2022-12-06 VITALS — BP 124/76 | HR 68 | Temp 96.8°F | Ht 69.0 in | Wt 206.0 lb

## 2022-12-06 DIAGNOSIS — E6609 Other obesity due to excess calories: Secondary | ICD-10-CM

## 2022-12-06 DIAGNOSIS — E7800 Pure hypercholesterolemia, unspecified: Secondary | ICD-10-CM

## 2022-12-06 DIAGNOSIS — R7303 Prediabetes: Secondary | ICD-10-CM | POA: Diagnosis not present

## 2022-12-06 DIAGNOSIS — Z125 Encounter for screening for malignant neoplasm of prostate: Secondary | ICD-10-CM | POA: Diagnosis not present

## 2022-12-06 DIAGNOSIS — Z0001 Encounter for general adult medical examination with abnormal findings: Secondary | ICD-10-CM

## 2022-12-06 DIAGNOSIS — Z6830 Body mass index (BMI) 30.0-30.9, adult: Secondary | ICD-10-CM

## 2022-12-06 MED ORDER — ATORVASTATIN CALCIUM 20 MG PO TABS: 20.0000 mg | ORAL_TABLET | Freq: Every day | ORAL | 3 refills | Status: DC

## 2022-12-06 NOTE — Progress Notes (Signed)
Subjective:    Patient ID: Richard [name], male    DOB: [birth year], 62 y.o.   MRN: [id number]  HPI  Patient presents to clinic today for his annual exam.  Flu: never Tetanus: 04/2013 COVID: never Shingrix: 04/2020, 07/2020 PSA screening: 10/2021 Colon screening: 11/2021, Cologuard, abnormal Vision screening: every 2 years Dentist: biannually  Diet: He does eat mostly lean meat. He consumes some fruits and veggies. He tries to avoid fried foods. He drinks mostly coffee, gatorade, dt. soda Exercise: None  Review of Systems     No past medical history on file.  Current Outpatient Medications  Medication Sig Dispense Refill   atorvastatin (LIPITOR) 20 MG tablet TAKE 1 TABLET(20 MG) BY MOUTH DAILY 90 tablet 0   Multiple Vitamin (MULTIVITAMIN) tablet Take 1 tablet by mouth daily.     omega-3 fish oil (MAXEPA) 1000 MG CAPS capsule Take 1 capsule by mouth daily.     No current facility-administered medications for this visit.    No Known Allergies  Family History  Problem Relation Age of Onset   Cancer Mother        liver   Cancer Maternal Uncle        bone   Diabetes Paternal Grandmother    Early death Neg Hx    Hypertension Neg Hx    Stroke Neg Hx     Social History   Socioeconomic History   Marital status: Widowed    Spouse name: Not on file   Number of children: Not on file   Years of education: Not on file   Highest education level: Associate degree: occupational, Scientist, product/process development, or vocational program  Occupational History   Not on file  Tobacco Use   Smoking status: Former    Current packs/day: 1.00    Average packs/day: 1 pack/day for 35.0 years (35.0 ttl pk-yrs)    Types: Cigarettes   Smokeless tobacco: Never   Tobacco comments:    02/2019  Substance and Sexual Activity   Alcohol use: No    Alcohol/week: 0.0 standard drinks of alcohol   Drug use: No   Sexual activity: Not on file  Other Topics Concern   Not on file  Social History Narrative   Not on  file   Social Determinants of Health   Financial Resource Strain: Low Risk  (12/05/2022)   Overall Financial Resource Strain (CARDIA)    Difficulty of Paying Living Expenses: Not very hard  Food Insecurity: No Food Insecurity (12/05/2022)   Hunger Vital Sign    Worried About Running Out of Food in the Last Year: Never true    Ran Out of Food in the Last Year: Never true  Transportation Needs: Unmet Transportation Needs (12/05/2022)   PRAPARE - Administrator, Civil Service (Medical): Yes    Lack of Transportation (Non-Medical): No  Physical Activity: Insufficiently Active (12/05/2022)   Exercise Vital Sign    Days of Exercise per Week: 1 day    Minutes of Exercise per Session: 10 min  Stress: No Stress Concern Present (12/05/2022)   [employer] of Occupational Health - Occupational Stress Questionnaire    Feeling of Stress : Only a little  Social Connections: Socially Isolated (12/05/2022)   Social Connection and Isolation Panel [NHANES]    Frequency of Communication with Friends and Family: Never    Frequency of Social Gatherings with Friends and Family: Never    Attends Religious Services: Never    Database administrator or  Organizations: No    Attends Banker Meetings: Not on file    Marital Status: Widowed  Intimate Partner Violence: Unknown (06/17/2021)   Received from [facility]   HITS    Physically Hurt: Not on file    Insult or Talk Down To: Not on file    Threaten Physical Harm: Not on file    Scream or Curse: Not on file     Constitutional: Denies fever, malaise, fatigue, headache or abrupt weight changes.  HEENT: Denies eye pain, eye redness, ear pain, ringing in the ears, wax buildup, runny nose, nasal congestion, bloody nose, or sore throat. Respiratory: Denies difficulty breathing, shortness of breath, cough or sputum production.   Cardiovascular: Denies chest pain, chest tightness, palpitations or swelling in the hands  or feet.  Gastrointestinal: Denies abdominal pain, bloating, constipation, diarrhea or blood in the stool.  GU: Denies urgency, frequency, pain with urination, burning sensation, blood in urine, odor or discharge. Musculoskeletal: Denies decrease in range of motion, difficulty with gait, muscle pain or joint pain and swelling.  Skin: Denies redness, rashes, lesions or ulcercations.  Neurological: Denies dizziness, difficulty with memory, difficulty with speech or problems with balance and coordination.  Psych: Denies anxiety, depression, SI/HI.  No other specific complaints in a complete review of systems (except as listed in HPI above).  Objective:   Physical Exam  BP 124/76 (BP Location: Left Arm, Patient Position: Sitting, Cuff Size: Normal)   Pulse 68   Temp (!) 96.8 F (36 C) (Temporal)   Ht 5\' 9"  (1.753 m)   Wt 206 lb (93.4 kg)   SpO2 95%   BMI 30.42 kg/m   Wt Readings from Last 3 Encounters:  11/03/21 206 lb (93.4 kg)  05/25/20 228 lb (103.4 kg)  01/19/20 230 lb (104.3 kg)    General: Appears his stated age, obese, in NAD. Skin: Warm, dry and intact. HEENT: Head: normal shape and size; Eyes: sclera white, no icterus, conjunctiva pink, PERRLA and EOMs intact;  Neck:  Neck supple, trachea midline. No masses, lumps or thyromegaly present.  Cardiovascular: Normal rate and rhythm. S1,S2 noted.  No murmur, rubs or gallops noted. No JVD or BLE edema. No carotid bruits noted. Pulmonary/Chest: Normal effort and positive vesicular breath sounds. No respiratory distress. No wheezes, rales or ronchi noted.  Abdomen: Soft and nontender. Normal bowel sounds.  Musculoskeletal: Strength 5/5 BUE/BLE.  No difficulty with gait.  Neurological: Alert and oriented. Cranial nerves [name]-XII grossly intact. Coordination normal.  Psychiatric: Mood and affect normal. Behavior is normal. Judgment and thought content normal.    BMET    Component Value Date/Time   NA 136 03/08/2022 0944   K 4.0  03/08/2022 0944   CL 101 03/08/2022 0944   CO2 24 03/08/2022 0944   GLUCOSE 84 03/08/2022 0944   BUN 16 03/08/2022 0944   CREATININE 1.00 03/08/2022 0944   CALCIUM 9.0 03/08/2022 0944   GFRNONAA 58 (L) 05/23/2014 1344   GFRAA 67 (L) 05/23/2014 1344    Lipid Panel     Component Value Date/Time   CHOL 194 03/08/2022 0944   TRIG 98 03/08/2022 0944   HDL 46 03/08/2022 0944   CHOLHDL 4.2 03/08/2022 0944   VLDL 55.2 (H) 05/25/2020 1538   LDLCALC 128 (H) 03/08/2022 0944    CBC    Component Value Date/Time   WBC 7.2 11/03/2021 1505   RBC 5.77 11/03/2021 1505   HGB 16.3 11/03/2021 1505   HCT 47.4 11/03/2021  1505   PLT 234 11/03/2021 1505   MCV 82.1 11/03/2021 1505   MCH 28.2 11/03/2021 1505   MCHC 34.4 11/03/2021 1505   RDW 13.8 11/03/2021 1505   LYMPHSABS 3.0 12/25/2014 1011   MONOABS 1.0 12/25/2014 1011   EOSABS 0.2 12/25/2014 1011   BASOSABS 0.0 12/25/2014 1011    Hgb A1C Lab Results  Component Value Date   HGBA1C 5.5 11/03/2021           Assessment & Plan:   Preventative health maintenance:  Flu shot declined Tetanus UTD Encouraged him to get his COVID-vaccine Shingrix UTD He declines referral to GI for screening colonoscopy Lung cancer screening not indicated- no longer smokes Encouraged him to consume a balanced diet and exercise regimen Advised him to see an eye doctor and dentist annually Will check CBC, c-Met, lipid, A1c and PSA today  RTC in 1 year, sooner if needed Nicki Reaper, NP

## 2022-12-06 NOTE — Patient Instructions (Signed)
Health Maintenance, Male Adopting a healthy lifestyle and getting preventive care are important in promoting health and wellness. Ask your health care provider about: The right schedule for you to have regular tests and exams. Things you can do on your own to prevent diseases and keep yourself healthy. What should I know about diet, weight, and exercise? Eat a healthy diet  Eat a diet that includes plenty of vegetables, fruits, low-fat dairy products, and lean protein. Do not eat a lot of foods that are high in solid fats, added sugars, or sodium. Maintain a healthy weight Body mass index (BMI) is a measurement that can be used to identify possible weight problems. It estimates body fat based on height and weight. Your health care provider can help determine your BMI and help you achieve or maintain a healthy weight. Get regular exercise Get regular exercise. This is one of the most important things you can do for your health. Most adults should: Exercise for at least 150 minutes each week. The exercise should increase your heart rate and make you sweat (moderate-intensity exercise). Do strengthening exercises at least twice a week. This is in addition to the moderate-intensity exercise. Spend less time sitting. Even light physical activity can be beneficial. Watch cholesterol and blood lipids Have your blood tested for lipids and cholesterol at 63 years of age, then have this test every 5 years. You may need to have your cholesterol levels checked more often if: Your lipid or cholesterol levels are high. You are older than 63 years of age. You are at high risk for heart disease. What should I know about cancer screening? Many types of cancers can be detected early and may often be prevented. Depending on your health history and family history, you may need to have cancer screening at various ages. This may include screening for: Colorectal cancer. Prostate cancer. Skin cancer. Lung  cancer. What should I know about heart disease, diabetes, and high blood pressure? Blood pressure and heart disease High blood pressure causes heart disease and increases the risk of stroke. This is more likely to develop in people who have high blood pressure readings or are overweight. Talk with your health care provider about your target blood pressure readings. Have your blood pressure checked: Every 3-5 years if you are 18-39 years of age. Every year if you are 40 years old or older. If you are between the ages of 65 and 75 and are a current or former smoker, ask your health care provider if you should have a one-time screening for abdominal aortic aneurysm (AAA). Diabetes Have regular diabetes screenings. This checks your fasting blood sugar level. Have the screening done: Once every three years after age 45 if you are at a normal weight and have a low risk for diabetes. More often and at a younger age if you are overweight or have a high risk for diabetes. What should I know about preventing infection? Hepatitis B If you have a higher risk for hepatitis B, you should be screened for this virus. Talk with your health care provider to find out if you are at risk for hepatitis B infection. Hepatitis C Blood testing is recommended for: Everyone born from 1945 through 1965. Anyone with known risk factors for hepatitis C. Sexually transmitted infections (STIs) You should be screened each year for STIs, including gonorrhea and chlamydia, if: You are sexually active and are younger than 63 years of age. You are older than 63 years of age and your   health care provider tells you that you are at risk for this type of infection. Your sexual activity has changed since you were last screened, and you are at increased risk for chlamydia or gonorrhea. Ask your health care provider if you are at risk. Ask your health care provider about whether you are at high risk for HIV. Your health care provider  may recommend a prescription medicine to help prevent HIV infection. If you choose to take medicine to prevent HIV, you should first get tested for HIV. You should then be tested every 3 months for as long as you are taking the medicine. Follow these instructions at home: Alcohol use Do not drink alcohol if your health care provider tells you not to drink. If you drink alcohol: Limit how much you have to 0-2 drinks a day. Know how much alcohol is in your drink. In the [place] one drink equals one 12 oz bottle of beer (355 mL), one 5 oz glass of wine (148 mL), or one 1 oz glass of hard liquor (44 mL). Lifestyle Do not use any products that contain nicotine or tobacco. These products include cigarettes, chewing tobacco, and vaping devices, such as e-cigarettes. If you need help quitting, ask your health care provider. Do not use street drugs. Do not share needles. Ask your health care provider for help if you need support or information about quitting drugs. General instructions Schedule regular health, dental, and eye exams. Stay current with your vaccines. Tell your health care provider if: You often feel depressed. You have ever been abused or do not feel safe at home. Summary Adopting a healthy lifestyle and getting preventive care are important in promoting health and wellness. Follow your health care provider's instructions about healthy diet, exercising, and getting tested or screened for diseases. Follow your health care provider's instructions on monitoring your cholesterol and blood pressure. This information is not intended to replace advice given to you by your health care provider. Make sure you discuss any questions you have with your health care provider. Document Revised: 07/19/2020 Document Reviewed: 07/19/2020 Elsevier Patient Education  2024 [employer]  

## 2022-12-06 NOTE — Assessment & Plan Note (Signed)
Encouraged diet and exercise for weight loss ?

## 2022-12-07 LAB — COMPLETE METABOLIC PANEL WITH GFR
AG Ratio: 1.8 (calc) (ref 1.0–2.5)
ALT: 26 U/L (ref 9–46)
AST: 17 U/L (ref 10–35)
Albumin: 4.8 g/dL (ref 3.6–5.1)
Alkaline phosphatase (APISO): 47 U/L (ref 35–144)
BUN: 22 mg/dL (ref 7–25)
CO2: 26 mmol/L (ref 20–32)
Calcium: 9.8 mg/dL (ref 8.6–10.3)
Chloride: 104 mmol/L (ref 98–110)
Creat: 0.94 mg/dL (ref 0.70–1.35)
Globulin: 2.6 g/dL (calc) (ref 1.9–3.7)
Glucose, Bld: 93 mg/dL (ref 65–99)
Potassium: 4.3 mmol/L (ref 3.5–5.3)
Sodium: 140 mmol/L (ref 135–146)
Total Bilirubin: 0.9 mg/dL (ref 0.2–1.2)
Total Protein: 7.4 g/dL (ref 6.1–8.1)
eGFR: 92 mL/min/{1.73_m2} (ref >=60)

## 2022-12-07 LAB — CBC
HCT: 49.3 % (ref 38.5–50.0)
Hemoglobin: 16.1 g/dL (ref 13.2–17.1)
MCH: 28.6 pg (ref 27.0–33.0)
MCHC: 32.7 g/dL (ref 32.0–36.0)
MCV: 87.7 fL (ref 80.0–100.0)
MPV: 9.9 fL (ref 7.5–12.5)
Platelets: 213 10*3/uL (ref 140–400)
RBC: 5.62 10*6/uL (ref 4.20–5.80)
RDW: 13.6 % (ref 11.0–15.0)
WBC: 6.6 10*3/uL (ref 3.8–10.8)

## 2022-12-07 LAB — LIPID PANEL
Cholesterol: 189 mg/dL (ref <200)
HDL: 51 mg/dL (ref >=40)
LDL Cholesterol (Calc): 112 mg/dL (calc) — ABNORMAL HIGH
Non-HDL Cholesterol (Calc): 138 mg/dL (calc) — ABNORMAL HIGH (ref <130)
Total CHOL/HDL Ratio: 3.7 (calc) (ref <5.0)
Triglycerides: 149 mg/dL (ref <150)

## 2022-12-07 LAB — HEMOGLOBIN A1C
Hgb A1c MFr Bld: 5.6 % of total Hgb (ref <5.7)
Mean Plasma Glucose: 114 mg/dL
eAG (mmol/L): 6.3 mmol/L

## 2022-12-07 LAB — PSA: PSA: 1.28 ng/mL (ref <=4.00)

## 2022-12-12 NOTE — Telephone Encounter (Signed)
Hello, patient reached out requesting a call from a nurse regarding concerns of blood clots in his legs after his procedure with Dr Gwenith Daily.     Best number [phone]     Thank you

## 2022-12-12 NOTE — Telephone Encounter (Signed)
Spoke with pt. He reports that had a procedure done, HoLEP, on 9/17 so far doing well, up till last night when he saw some blood clots, throughout the day it has cleared up. Nothing so far. He is a little concern about this. Pt. Is not on blood thinners or ASA.   Told that the procedure itself is to scrape some of the prostate  tissue. He may see some blood clots or specks of tissue come out. Hydrate well. This will help flush out any residual. Stated that feels well so far.   Told to call back if anything changes.

## 2022-12-13 NOTE — Telephone Encounter (Signed)
Called patient and scheduled PVR for Thursday

## 2022-12-13 NOTE — Telephone Encounter (Signed)
Received voicemail from patient, would like to come in for PVR. Can I schedule this

## 2022-12-14 ENCOUNTER — Institutional Professional Consult (permissible substitution): Admit: 2022-12-14 | Discharge: 2022-12-14 | Payer: PRIVATE HEALTH INSURANCE | Primary: Internal Medicine

## 2022-12-14 DIAGNOSIS — R339 Retention of urine, unspecified: Secondary | ICD-10-CM

## 2022-12-14 NOTE — Progress Notes (Signed)
[facility]  [address]  [address]  [place] [address]  [phone]    Chief Complaint:     Patient ID:     Brian Tucker 62 y.o. male who is followed by Dr. Gwenith Daily for lowe urinary tract sxs. And now s/p HoLEP. 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 Thyroid CA in past    Irregular astigmatism of both eyes 11/12/2021    Keratitis 08/10/2021    Kidney stones 06/27/2022    Lower urinary tract symptoms (LUTS) 06/27/2022    Pseudophakia, left eye 11/12/2021    Visual impairment 07/2021        Objective   Visit Vitals  BP 106/70 (BP Location: Left arm, Patient Position: Sitting, BP Cuff Size: Adult)   Pulse 81   Temp 36.6 C (97.8 F) (Oral)   Wt 75.3 kg   SpO2 98%   BMI 22.51 kg/m      Physical Exam  General: NAD    Musculoskeletal: Ambulating well; no joint swelling or tenderness.     PROCEDURE:    PVR in clinic 3 cc has been able to void with good flow, still gets up in the middle of the night but not as frequently as often as he used to before the procedure. Blood clots and blood in urine was concerning to him, but has resolved.     PLAN:     -Follow up with Dr. Gwenith Daily in Dec.   -call back if new sxs develop    Christie Beckers, RN

## 2023-01-03 ENCOUNTER — Ambulatory Visit
Admit: 2023-01-03 | Discharge: 2023-01-03 | Payer: PRIVATE HEALTH INSURANCE | Attending: Specialist | Primary: Internal Medicine

## 2023-01-03 DIAGNOSIS — Z961 Presence of intraocular lens: Secondary | ICD-10-CM

## 2023-01-03 DIAGNOSIS — H0100A Unspecified blepharitis right eye, upper and lower eyelids: Secondary | ICD-10-CM

## 2023-01-03 NOTE — Progress Notes (Signed)
01/03/23    Self-referred for 2nd opinion of management of post-op CE/IOL OS and consideration of eventual CE/IOL OD. Sister is patient of Dr. Haskell Riling. 1st visit with Dr. Dorna Bloom 08/03/21.     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  Mild OD  ture    Keratitis  Healed keratitis OS-initially thought to be herpetic    Demodex Blepharitis, Both eye  01/03/23  Very thick meibum, significant MGD  Very occasional FBS  Patient reports stye/chalazion/clogged meibomian gland LLL after finishing Xdemvy in late August, cleared with WC use  After stye, patient reported mucus OS  PLAN  Tea tree oil wipes  Follow-up: 6 months    09/27/22  Improving FBS, still mild discomfort OS only  Finished 42 days of Xdemvy  Exam with significant improvement in collarettes but still present OS>OD    PLAN:  Finish bottle of Xdemvy  Then continue with tea tree oil wipes and lid scrubs  PFAT prn  Can use xdemvy again, PRN. An additional bottle ordered today.   RV 2-3 months for surface check      06/28/22  FBS in the morning with occasional irritation   Has not started Xdemvy yet.  Xdemvy 2 times daily both eyes for 42 days    RV 3 months for surface check     -----------------------------------  Previous notes  S/p CE/IOL OS by Dr. Elnoria Howard at [facility] on 06/30/21  - Patient reports vision OS initially quite good. However within ~10 days post-operatively had gradually decreased vision which slowly progressed. Post-operative history until today's visit as below:     - 07/01/21 OCB VA OS 20/20, normal post-operative changes  - 07/07/21 called to report new onset floaters OS without flashes or curtain/shadow symptoms. Reassured by on-call [facility] fellow.  - 07/11/21 OCB POD10 VA 20/20-2, noted to have new PEEs, reassured, recommended to buy OTC ATs  - 07/20/21 called  on-call fellow to report blurriness and haziness since last post-op visit. Denied pain, redness, light sensitivity. Recommended to start PFATs QID.  - 07/23/21 OCB POW3 VA 20/50+2 ph 20/30, noted to have worsened PEEs, recommended to finish PF taper, stop ketorolac, start PFATs q2h OS, start emycin ung  - 07/25/21 OCB VA 20/80 noted to have severe dry eye OS, recommended to d/c pred, continue PFATs, start Refresh PM BID, d/c erythromycin ointment  - 07/26/21 OCB VA CF at 3', noted to have central epi defect without infiltrate, no cell, BCL placed, start ofloxacin TID OS, PFATs PRN, d/c AT ung  - 07/27/21 MEEI VA CF at 3', noted to have corneal edema, central epi defect, diffuse staining with irregular epithelium, no cell. B scan OS without pseudophacodonesis, choroidal effusion, vitritis. Noted to have Slight thickening and hazy appearance of retinochoroidal layer; engorged vortex veins are noted in the inferonasal equatorial region. Diagnosed with postop corneal edema/melt OS. Suspected due to postop ketorolac use. BCL placed, started on moxifloxacin QID, muro QID.  - 07/29/21 OCB VA CF at 3', irregular epithelium with 1+ edema, 1+ folds, trace cell/flare. BCL was d/c'ed. Continued on ofloxacin TID OS. Started on pred BID OS. Muro increased to q2h.   - 08/02/21 OCB cornea VA 20/60-2, intact corneal sensation, noted to have paracentral dendrite between 5-9 oc  approx 4.4 mm in length with few terminal bulbs, no epi defect, no edema, trace cell/flare. Recommended by Dr. Smith Robert to start Valtrex 500 mg TID, erythryomycin ung 1-2x daily, stop pred, stop ofloxacin, stop muro, use artificial tear gel drops PRN  - Today 08/03/21: Patient reports gradual improvement in vision. Reports mild pain and more severe photosensitivity peaked last week. Denies current pain or photophobia. Currently using Muro 128 QID OS, Prednisolone BID OS, ofloxacin TID OS. 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 Smith Robert yesterday  so has not started recommended treatments.   - VA OS 20/200 ph 20/70. IOP wnl.  - Exam OS notable for tr central D-folds, 4.5 mm linear epithelial staining paracentrally 5 oc to 9 oc; diffuse PEE. Appearance of epithelial staining could be suspicious for HSV.   Exam also notable for ABMD, which could contribute to lack of reepithelialization    ECC 08/03/21  OD: CD 2146, CV 32, HEX 51  OS: CD 1739, CV 28, HEX 58    Topography 08/03/21  OD: 41.82 and 41.58 @ 172. Astigmatism 0.24 D.  OS: 38.98 and 37.65 @ 96. Astigmatism 1.33 D.    Pentacam 08/03/21:  OD: 0.2 D astigmatism  OS: 0.1 D astigmatism. Inferior steepening.     Corneal sensation: normal OD; OS possible decrease    Imp/Plan:   Agree with Valtrex 500mg  TID PO  Restart emycin ung   Continue Muro 28 QID   Continue PF BID (consider taper at next visit)  Continue Ofloxacin TID   Continue Refresh PM nightly  Send for HSV 1&2 serology today   Consider confocal for nerves in the future     08/10/21  Started the Valtrex Friday. 2 weeks of Valtrex and then a recheck.  Possible nausea with Valtrex. Vision is still distorted.   Plan:  Continue Valtrex 500mg  TID PO  Continue Erythromycin QHS   Continue Muro 128 TID  Continue PF BID (consider taper at next visit)  Continue Ofloxacin TID       08/26/21  Vision is cloudy intermittently ("fades out")  Corneal staining is resolved, except for ABMD  Suspect blurry intermittent vision is due to preservatives in drops    Plan:  Stopped valtrex due to nausea  Stop Erythromycin QHS   Continue Muro 128 drops OS TID-could decrease BID x 2-3 weeks then stop  Decrease PF once daily for two weeks then stop  Stop ofloxacin       03/01/22    PLAN:  Written instructions for taper schedule for eye drops provided:  Taper Prednisolone TID-BID-once/day   Continue Prolensa once/day for one week then stop  Stop Antibiotic eye drop  Artificial tears as needed.

## 2023-02-19 LAB — CMP (EXT)
ALT/SGPT (EXT): 26 U/L (ref 13–61)
AST/SGOT (EXT): 23 U/L (ref 15–37)
Albumin (EXT): 3.9 g/dL (ref 3.0–4.5)
Alkaline Phosphatase (EXT): 108 U/L (ref 46–130)
BUN (EXT): 18 mg/dL (ref 7–18)
Bilirubin, Total (EXT): 1.7 mg/dL — ABNORMAL HIGH (ref 0.2–1.1)
CO2 (EXT): 29 mmol/L (ref 21–32)
CalciumCalcium (EXT): 8.8 mg/dL (ref 8.5–10.1)
Chloride (EXT): 108 mmol/L (ref 98–110)
Creatinine (EXT): 1 mg/dL (ref 0.5–1.3)
GFR Estimated (Calc) (EXT): 85 mL/min/1.73m2 (ref >59)
Glucose (EXT): 111 mg/dL — ABNORMAL HIGH (ref 74–106)
Potassium (EXT): 4.4 mmol/L (ref 3.5–5.1)
Protein (EXT): 6.7 g/dL (ref 6.4–8.2)
Sodium (EXT): 140 mmol/L (ref 136–145)

## 2023-02-19 LAB — LIPID PROFILE (EXT)
Cholesterol (EXT): 186 mg/dL (ref 0–199)
HDL Cholesterol (EXT): 63 mg/dL (ref >39)
LDL Cholesterol (EXT): 112 mg/dL (ref 50–129)
NON HDL Cholesterol (EXT): 123 mg/dL
Triglycerides (EXT): 54 mg/dL (ref 0–150)

## 2023-02-28 ENCOUNTER — Ambulatory Visit
Admit: 2023-02-28 | Discharge: 2023-02-28 | Payer: PRIVATE HEALTH INSURANCE | Attending: Student in an Organized Health Care Education/Training Program | Primary: Internal Medicine

## 2023-02-28 DIAGNOSIS — Z125 Encounter for screening for malignant neoplasm of prostate: Secondary | ICD-10-CM

## 2023-02-28 LAB — POCT URINALYSIS DIPSTICK
Bilirubin, UA, POC: NEGATIVE
Blood, UA, POC: NEGATIVE
Glucose, UA, POC: NORMAL
Ketones, UA, POC: NEGATIVE
Leukocytes, UA, POC: NEGATIVE
Nitrite, UA, POC: NEGATIVE
Protein, UA, POC: NEGATIVE
Spec Gravity, UA, POC: 1.015 (ref 1.001–1.035)
Urobilinogen, UA, POC: NORMAL
pH, UA, POC: 5.5 (ref 5.0–8.0)

## 2023-02-28 NOTE — Progress Notes (Signed)
[facility]  [facility]  [address]  [place] [address]  Dept: [phone]  Dept Fax: [phone]    UROLOGY CLINIC NOTE    Chief complaint:  Bladder stones  Lower urinary tract symptoms (LUTS)    History of present illness:  This is a 62 y.o. male with a medical history including but not limited to cancer, ABMD of both eyes, cataract (Jan 2023), disease of thyroid gland, HSV epithelial keratitis, hypothyroidism, irregular astigmatism of both eyes, keratitis, left eye pseudophakia, visual impairment, kidney stones, LUTS, ED, and bladder stones.      1. Bladder stones:  Since 2021. Previously passed small bladder stones that were uric acid in nature in 10/2020 (previously followed by Dr. Larose Hires [facility]).    Of note, the patient was previously followed by [facility] in 10/2020 and [facility] in 04/2021.    Cystoscopy on 02/21/2021 with Dr. Lucilla Lame [facility]) revealed multiple tiny calcifications in the bladder (ie. Milk of magnesia) creating a snow globe appearance. Bladder outlet obstruction.    Per patient, every couple of months he passes several bladder stones. Stone analysis as below.     Stone Analysis:   09/17/2020: 80% Uric acid dihydrate  20% Calcium oxalate dihydrate     LithoLink:   08/02/2022:       CT Scan ABD/Pelvis without contrast on 07/28/2022 revealed no hydronephrosis. No radiopaque renal stones. Thin hyperdensity along the anterior bladder wall, representing layering tiny bladder stones versus wall calcification. No significant bladder wall thickening. Prostatomegaly. 4 mm right lower lobe pulmonary nodule. Consider follow-up chest CT in 12 months.    No further bladder stones since last visit in clinic 11/2022.      2. Lower urinary tract symptoms (LUTS):  Since 2020. His symptoms include a slow urinary stream, incomplete bladder emptying, urinary frequency q 2 hours during the day, urinary urgency and nocturia x 1-2.  Currently managed with Alfuzosin 10 mg since 04/2021 with moderate effect.    PVR 07/03/22: 267ccs  IPSS: 24 / 4    Cystoscopy 08/2022 with significant intravesical growth and 3+ trabeculation, unable to provide flow. Outlet obstruction due to large 80gm intravesical prostate and deteriorating bladder function seen on cystoscopy. No bladder stone visualized.     UCx 11/2022, unremarkable.     S/p HoLEP 11/28/2022. Tissue pathology showed benign prostatic hyperplasia. Benign urothelium.     VT passed on December 02, 2022. Pt called 12/12/2022 with concern of blood clots post procedure. PVR in GU clinic on 12/14/2022 3 mL. Gross hematuria and blood clots resolved shortly after appointment in 12/2022.  No post-op incontinence whatsover.    In clinic 02/2023, states he is very happy with the procedure and doing well. Feels he is able to fully empty his bladder with a strong stream. Daytime urinary frequency q2-3 hours, and nocturia x 1. He has not had any urinary incontinence. 1 x episode of retrograde ejaculation.      IPSS: 3/0  PVR: 110 mL    3. Hematospermia: Since 03/2021. Patient reports multiple episodes of blood in his semen over the past few months. It has yet to resolve. No recent imaging. His last cystoscopy was 02/2021 (details as above).    Denies further episodes of hematospermia since he was last seen in clinic in 11/2022.    The patient has no urgent complaints.     Denies gross hematuria, urethral discharge, pyuria, dysuria, fever, chills, nausea vomiting, abdominal  pain, flank pain and weight loss.    PSA:   04/01/2016: 1.0 - [facility].  09/14/2020: 1.54 - [facility] [employer].  10/27/2021: 1.62 - [facility] [employer].  07/28/2022: 1.6.       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       Past Surgical History:   Procedure Laterality Date    CATARACT EXTRACTION Right 02/21/2022    COLONOSCOPY      EYE SURGERY  June 30, 2021    cataract    INGUINAL HERNIA REPAIR  February 2023    OTHER SURGICAL HISTORY      colorectal surgery due to precancer cells    THYROIDECTOMY       Social History     Tobacco Use    Smoking status: Never     Passive exposure: Never    Smokeless tobacco: Never   Vaping Use    Vaping status: Never Used   Substance Use Topics    Alcohol use: Yes     Alcohol/week: 1.0 standard drink of alcohol     Types: 1 Glasses of wine per week     Comment: 1 glass of wine on occasion    Drug use: Never       Home Medications:    Current Outpatient Medications:     levothyroxine (Synthroid, Levoxyl) 137 mcg tablet, Take 137 mcg by mouth once daily., Disp: , Rfl:     sildenafil (Viagra) 100 mg tablet, Take 50 mg by mouth if needed., Disp: , Rfl:     ketorolac (Acular) 0.5 % ophthalmic solution, Administer 1 drop into the right eye four times daily. Start 1 day before surgery, Disp: 10 mL, Rfl: 3    ofloxacin (Ocuflox) 0.3 % ophthalmic solution, Administer 1 drop into the right eye four times daily. Start 1 day before surgery (Patient not taking: Reported on 04/05/2022), Disp: 10 mL, Rfl: 3    prednisoLONE acetate (Pred-Forte) 1 % ophthalmic suspension, Administer 1 drop into the right eye four times daily. Start 1 day before surgery (Patient not taking: Reported on 04/05/2022), Disp: 10 mL, Rfl: 3    Prolensa 0.07 % drops, ADMINISTER 1 DROP INTO AFFECTED EYE(S) ONCE DAILY. AFTER SURGERY- NOT  COVERED (Patient not taking: Reported on 04/05/2022), Disp: 9 mL, Rfl: 3    Allergies:  No Known Allergies     ROS:   Constitutional: No fevers, chills, weight loss or general weakness.   HENT: No headache or dizziness, otherwise negative.  Eyes: No loss of vision or double vision.   Lungs: No shortness of breath. No new, frequent cough.   Cardiovascular: No chest pain or palpitations.   Gastrointestinal: No  abdominal pain, nausea, or vomiting  Genitourinary: See HPI.  Musculoskeletal: No joint/muscle pain or swelling.  Skin: Negative for rash  Endocrine: Negative   Hematologic: No easy bruising or bleeding  Neurological: No seizures, light headedness, or numbness  Psychiatry: No mood or behavioral changes.    Objective:    Vitals:    02/28/23 0952   BP: 108/72   Pulse: 71   SpO2: 99%           Physical exam:  General Appearance: Well-developed, well nourished, alert and cooperative, NAD  HEENT: Normo-cephalic, atraumatic, EOMI, anicteric, vision grossly intact, no nasal discharge.   Skin: Normal color, texture, turgor, with no lesions, eruptions, rashes, bruises or petechiae.  Neck: Supple, no masses. Normal ROM. Trachea is in midline.   Chest: Normal AP diameter. No rib tenderness.  Lungs: Normal respiratory effort, symmetric excursion with no accessory muscle use.  Back: Vertebral column aligned, no masses or tenderness. No CVAT b/l.   Cardiovascular: regular rate, no varicosities  Abdomen: Soft, benign, non-tender, non-distended, no palpable masses.  Bladder non palpable  Extremities: No clubbing, cyanosis or pitting edema.   Musculo-Skeletal: No muscle wasting, joint swelling or tenderness.  Lymphatic: No cervical or inguinal adenopathy.   Neurologic: Oriented x3 with no focal deficits, normal gait.    GU Male:   Phallus: Normally developed, no lesions.   Glans: No lesions or inflammation.  Meatus: Orthotopic, no discharge.  Scrotum: No mass or tenderness. No hernias, hydroceles or varicoceles  Epididymis: No  masses, tenderness or induration.  Testes: Normal size and consistency. No masses, asymmetry, tenderness or atrophy.  Rectal: Normal sphincter tone. No perineal, anal or rectal lesions.  Prostate: 2+ enlarged, smooth, non-tender and symmetrical, no palpable nodules  Bladder: non-tender and not palpable.    Pathology:  Tissue Pathology:  11/28/2022:   Component    Final Diagnosis   A. Prostate; Prostatic Chips:  Benign prostatic hyperplasia.   Benign urothelium.     Electronically signed by Morrie Sheldon, MD on 12/04/2022 at 1709   Clinical Information    Benign prostatic hyperplasia with urinary obstruction       Results: I have reviewed and interpreted the labs below:      BMP:   No results found for: "NA", "K", "CL", "CO2", "BUN", "CREATININE", "GLU"      No results found for: "PTH", "CALCIUM"      Last Urinalysis:   Protein, UA, POC (no units)   Date Value   02/28/2023 Negative      No results found for: "NITRUR", "LEUKOUR", "SQUAUR", "MUCOUR"   No results found for: "BACTUR"   Blood, UA, POC (no units)   Date Value   02/28/2023 Negative     BUN,Cr,GFR:  01/2017: 18, 0.98, 85  04/2018: 19, 0.90, 94  02/2023: 18, 1.00, 85    Stone Analysis:   11/06/2016: 100% CaOx Monohydrate  09/17/2020: 80% Uric acid dihydrate  20% Calcium oxalate dihydrate         PSA:   04/01/2016: 1.0 - [facility].  09/14/2020: 1.54 - [facility].  10/27/2021: 1.62 - [facility] [employer].  07/28/2022: 1.6.     LithoLink:   08/02/2022:           Imaging (Reviewed by Dr. Gwenith Daily):  Cystoscopy:  02/21/2021 (Dr. Lucilla Lame - [facility]): Multiple tiny calcifications in the bladder (ie. Milk of magnesia) creating a snow globe appearance. Bladder outlet obstruction.  08/2022:  with significant intravesical growth and 3+ trabeculation, unable  to provide flow. Outlet obstruction due to large 80gm intravesical prostate and deteriorating bladder function seen on cystoscopy. No bladder stones visualized.     CT Scan ABD/Pelvis without  contrast:  07/28/2022: No hydronephrosis. No radiopaque renal stones. Thin hyperdensity along the anterior bladder wall, representing layering tiny bladder stones versus wall calcification. No significant bladder wall thickening. Prostatomegaly. 4 mm right lower lobe pulmonary nodule. Consider follow-up chest CT in 12 months.      Assessment/Plan:  This is a 62 y.o. male with the following urologic problems:    1. Bladder and Kidney stones: Patient passes several bladder stones every few months x 2-3 years. Likely due to bladder outlet obstruction. Patient also with a history of renal colic in 2018 requiring URS/HLL. CTAP on 07/2022 with no renal stones or hydro, layering tiny bladder stones vs calcifications along anterior bladder wall. Litholink from 07/2022 with suboptimal urine volume, hyperuricosuria, and mild acid supersaturation.     Plan:  - Decrease animal proteins, add more citrate to diet.    2. Lower urinary tract symptoms (LUTS): Symptoms include slow urinary stream, incomplete bladder emptying, urinary frequency q 2 hours during the day, urinary urgency and nocturia x 1-2. Currently managed with Alfuzosin 10 mg since 04/2021 with persistent moderate-severe bothersome LUTS. Cystoscopy today with significant intravesical growth and 3+ trabeculation, unable to provide flow. No bladder stones visualized. Discussed outlet obstruction due to large 80gm intravesical prostate and deteriorating bladder function seen on cystoscopy. Patient agreeable.    Now s/p HoLEP in 11/2022 at [facility]. 16gm benign tissue removed. He is very happy with the outcome of his procedure. PVR today 50mL, IPSS 3/0.     Plan:  -Resolved    -F/U PRN recurrent LUTS    3. Hematospermia: Multiple episodes over the past 2-3 months.  Discussed likely benign etiology.    Plan:  -Observation    4. PSA screening; PSA 1.7 07/2022.  Advised continued annual monitoring and RTC for PSA >3.  Suspect will be < 1 s/p HoLEP.  - Obtain baseline PSA s/p  HoLEP.    IDerald Macleod, MD, spent a total of 30 minutes (this includes time spent in preparation of the note) with the patient Anant Goodridge reviewing the medical history, labs and imaging for management, treatment, counseling and coordination of care.    Derald Macleod, M.D.  Urology

## 2023-02-28 NOTE — Progress Notes (Signed)
patient's bladder was scanned with a Verathon U/S scanner at least 3 times with volume 110 mL.

## 2023-03-12 LAB — PSA FREE AND TOTAL (EXT)
PSA (EXT): 0.36 ng/mL (ref <=4.5)
PSA, Free (EXT): 0.1 ng/mL

## 2023-12-11 ENCOUNTER — Encounter: Payer: Self-pay | Admitting: Internal Medicine

## 2023-12-11 ENCOUNTER — Ambulatory Visit: Payer: Self-pay | Admitting: Internal Medicine

## 2023-12-11 VITALS — BP 124/74 | Ht 69.0 in | Wt 206.6 lb

## 2023-12-11 DIAGNOSIS — E7800 Pure hypercholesterolemia, unspecified: Secondary | ICD-10-CM | POA: Diagnosis not present

## 2023-12-11 DIAGNOSIS — E66811 Obesity, class 1: Secondary | ICD-10-CM

## 2023-12-11 DIAGNOSIS — Z23 Encounter for immunization: Secondary | ICD-10-CM | POA: Diagnosis not present

## 2023-12-11 DIAGNOSIS — Z125 Encounter for screening for malignant neoplasm of prostate: Secondary | ICD-10-CM | POA: Diagnosis not present

## 2023-12-11 DIAGNOSIS — R7303 Prediabetes: Secondary | ICD-10-CM | POA: Diagnosis not present

## 2023-12-11 DIAGNOSIS — Z0001 Encounter for general adult medical examination with abnormal findings: Secondary | ICD-10-CM

## 2023-12-11 DIAGNOSIS — E6609 Other obesity due to excess calories: Secondary | ICD-10-CM

## 2023-12-11 DIAGNOSIS — Z6830 Body mass index (BMI) 30.0-30.9, adult: Secondary | ICD-10-CM

## 2023-12-11 NOTE — Assessment & Plan Note (Signed)
 Encouraged diet and exercise for weight loss ?

## 2023-12-11 NOTE — Progress Notes (Signed)
 Subjective:    Patient ID: Richard [name], male    DOB: 06/16/1960, 63 y.o.   MRN: [id number]  HPI  Patient presents to clinic today for his annual exam.  Flu: never Tetanus: 04/2013 COVID: never Shingrix: 04/2020, 07/2020 PSA screening: 11/2022 Colon screening: 11/2021, Cologuard, abnormal Vision screening: every 2 years Dentist: biannually  Diet: He does eat mostly lean meat. He consumes some fruits and veggies. He tries to avoid fried foods. He drinks mostly coffee, gatorade, dt. soda Exercise: None  Review of Systems     No past medical history on file.  Current Outpatient Medications  Medication Sig Dispense Refill   atorvastatin  (LIPITOR) 20 MG tablet Take 1 tablet (20 mg total) by mouth daily. 90 tablet 3   Multiple Vitamin (MULTIVITAMIN) tablet Take 1 tablet by mouth daily.     No current facility-administered medications for this visit.    No Known Allergies  Family History  Problem Relation Age of Onset   Liver cancer Mother    Diabetes Paternal Grandmother    Bone cancer Maternal Uncle    Early death Neg Hx    Hypertension Neg Hx    Stroke Neg Hx     Social History   Socioeconomic History   Marital status: Widowed    Spouse name: Not on file   Number of children: Not on file   Years of education: Not on file   Highest education level: Some college, no degree  Occupational History   Not on file  Tobacco Use   Smoking status: Former    Current packs/day: 1.00    Average packs/day: 1 pack/day for 35.0 years (35.0 ttl pk-yrs)    Types: Cigarettes   Smokeless tobacco: Never   Tobacco comments:    02/2019  Substance and Sexual Activity   Alcohol use: No    Alcohol/week: 0.0 standard drinks of alcohol   Drug use: No   Sexual activity: Not on file  Other Topics Concern   Not on file  Social History Narrative   Not on file   Social Drivers of Health   Financial Resource Strain: Low Risk  (12/10/2023)   Overall Financial Resource Strain  (CARDIA)    Difficulty of Paying Living Expenses: Not hard at all  Food Insecurity: No Food Insecurity (12/10/2023)   Hunger Vital Sign    Worried About Running Out of Food in the Last Year: Never true    Ran Out of Food in the Last Year: Never true  Transportation Needs: No Transportation Needs (12/10/2023)   PRAPARE - Administrator, Civil Service (Medical): No    Lack of Transportation (Non-Medical): No  Physical Activity: Inactive (12/10/2023)   Exercise Vital Sign    Days of Exercise per Week: 0 days    Minutes of Exercise per Session: Not on file  Stress: No Stress Concern Present (12/10/2023)   [employer] of Occupational Health - Occupational Stress Questionnaire    Feeling of Stress: Not at all  Social Connections: Socially Isolated (12/10/2023)   Social Connection and Isolation Panel    Frequency of Communication with Friends and Family: Three times a week    Frequency of Social Gatherings with Friends and Family: Once a week    Attends Religious Services: Never    Database administrator or Organizations: No    Attends Engineer, structural: Not on file    Marital Status: Widowed  Intimate Partner Violence: Unknown (06/17/2021)  Received from [facility]   HITS    Physically Hurt: Not on file    Insult or Talk Down To: Not on file    Threaten Physical Harm: Not on file    Scream or Curse: Not on file     Constitutional: Denies fever, malaise, fatigue, headache or abrupt weight changes.  HEENT: Denies eye pain, eye redness, ear pain, ringing in the ears, wax buildup, runny nose, nasal congestion, bloody nose, or sore throat. Respiratory: Denies difficulty breathing, shortness of breath, cough or sputum production.   Cardiovascular: Denies chest pain, chest tightness, palpitations or swelling in the hands or feet.  Gastrointestinal: Denies abdominal pain, bloating, constipation, diarrhea or blood in the stool.  GU: Denies urgency, frequency, pain  with urination, burning sensation, blood in urine, odor or discharge. Musculoskeletal: Pt reports intermittent right foot pain. Denies decrease in range of motion, difficulty with gait, muscle pain or joint  swelling.  Skin: Denies redness, rashes, lesions or ulcercations.  Neurological: Denies dizziness, difficulty with memory, difficulty with speech or problems with balance and coordination.  Psych: Denies anxiety, depression, SI/HI.  No other specific complaints in a complete review of systems (except as listed in HPI above).  Objective:   Physical Exam  BP 124/74 (BP Location: Left Arm, Patient Position: Sitting, Cuff Size: Large)   Ht 5' 9 (1.753 m)   Wt 206 lb 9.6 oz (93.7 kg)   BMI 30.51 kg/m    Wt Readings from Last 3 Encounters:  12/06/22 206 lb (93.4 kg)  11/03/21 206 lb (93.4 kg)  05/25/20 228 lb (103.4 kg)    General: Appears his stated age, obese, in NAD. Skin: Warm, dry and intact. HEENT: Head: normal shape and size; Eyes: sclera white, no icterus, conjunctiva pink, PERRLA and EOMs intact;  Neck:  Neck supple, trachea midline. No masses, lumps or thyromegaly present.  Cardiovascular: Normal rate and rhythm. S1,S2 noted.  No murmur, rubs or gallops noted. No JVD or BLE edema. No carotid bruits noted. Pulmonary/Chest: Normal effort and positive vesicular breath sounds. No respiratory distress. No wheezes, rales or ronchi noted.  Abdomen: Soft and nontender. Normal bowel sounds.  Musculoskeletal: Strength 5/5 BUE/BLE.  No difficulty with gait.  Neurological: Alert and oriented. Cranial nerves [name]-XII grossly intact. Coordination normal.  Psychiatric: Mood and affect normal. Behavior is normal. Judgment and thought content normal.    BMET    Component Value Date/Time   NA 140 12/06/2022 1047   K 4.3 12/06/2022 1047   CL 104 12/06/2022 1047   CO2 26 12/06/2022 1047   GLUCOSE 93 12/06/2022 1047   BUN 22 12/06/2022 1047   CREATININE 0.94 12/06/2022 1047   CALCIUM   9.8 12/06/2022 1047   GFRNONAA 58 (L) 05/23/2014 1344   GFRAA 67 (L) 05/23/2014 1344    Lipid Panel     Component Value Date/Time   CHOL 189 12/06/2022 1047   TRIG 149 12/06/2022 1047   HDL 51 12/06/2022 1047   CHOLHDL 3.7 12/06/2022 1047   VLDL 55.2 (H) 05/25/2020 1538   LDLCALC 112 (H) 12/06/2022 1047    CBC    Component Value Date/Time   WBC 6.6 12/06/2022 1047   RBC 5.62 12/06/2022 1047   HGB 16.1 12/06/2022 1047   HCT 49.3 12/06/2022 1047   PLT 213 12/06/2022 1047   MCV 87.7 12/06/2022 1047   MCH 28.6 12/06/2022 1047   MCHC 32.7 12/06/2022 1047   RDW 13.6 12/06/2022 1047   LYMPHSABS 3.0 12/25/2014 1011  MONOABS 1.0 12/25/2014 1011   EOSABS 0.2 12/25/2014 1011   BASOSABS 0.0 12/25/2014 1011    Hgb A1C Lab Results  Component Value Date   HGBA1C 5.6 12/06/2022           Assessment & Plan:   Preventative health maintenance:  Flu shot declined Tdap today Encouraged him to get his COVID-vaccine Shingrix UTD He declines referral to GI for screening colonoscopy Lung cancer screening not indicated- no longer smokes Encouraged him to consume a balanced diet and exercise regimen Advised him to see an eye doctor and dentist annually Will check CBC, c-Met, lipid, A1c and PSA today  RTC in 6 months for followup chronic conditions Angeline Laura, NP

## 2023-12-11 NOTE — Patient Instructions (Signed)
 Health Maintenance, Male  Adopting a healthy lifestyle and getting preventive care are important in promoting health and wellness. Ask your health care provider about:  The right schedule for you to have regular tests and exams.  Things you can do on your own to prevent diseases and keep yourself healthy.  What should I know about diet, weight, and exercise?  Eat a healthy diet    Eat a diet that includes plenty of vegetables, fruits, low-fat dairy products, and lean protein.  Do not eat a lot of foods that are high in solid fats, added sugars, or sodium.  Maintain a healthy weight  Body mass index (BMI) is a measurement that can be used to identify possible weight problems. It estimates body fat based on height and weight. Your health care provider can help determine your BMI and help you achieve or maintain a healthy weight.  Get regular exercise  Get regular exercise. This is one of the most important things you can do for your health. Most adults should:  Exercise for at least 150 minutes each week. The exercise should increase your heart rate and make you sweat (moderate-intensity exercise).  Do strengthening exercises at least twice a week. This is in addition to the moderate-intensity exercise.  Spend less time sitting. Even light physical activity can be beneficial.  Watch cholesterol and blood lipids  Have your blood tested for lipids and cholesterol at 64 years of age, then have this test every 5 years.  You may need to have your cholesterol levels checked more often if:  Your lipid or cholesterol levels are high.  You are older than 64 years of age.  You are at high risk for heart disease.  What should I know about cancer screening?  Many types of cancers can be detected early and may often be prevented. Depending on your health history and family history, you may need to have cancer screening at various ages. This may include screening for:  Colorectal cancer.  Prostate cancer.  Skin cancer.  Lung  cancer.  What should I know about heart disease, diabetes, and high blood pressure?  Blood pressure and heart disease  High blood pressure causes heart disease and increases the risk of stroke. This is more likely to develop in people who have high blood pressure readings or are overweight.  Talk with your health care provider about your target blood pressure readings.  Have your blood pressure checked:  Every 3-5 years if you are 24-52 years of age.  Every year if you are 3 years old or older.  If you are between the ages of 60 and 72 and are a current or former smoker, ask your health care provider if you should have a one-time screening for abdominal aortic aneurysm (AAA).  Diabetes  Have regular diabetes screenings. This checks your fasting blood sugar level. Have the screening done:  Once every three years after age 66 if you are at a normal weight and have a low risk for diabetes.  More often and at a younger age if you are overweight or have a high risk for diabetes.  What should I know about preventing infection?  Hepatitis B  If you have a higher risk for hepatitis B, you should be screened for this virus. Talk with your health care provider to find out if you are at risk for hepatitis B infection.  Hepatitis C  Blood testing is recommended for:  Everyone born from [birth year] through 1966.  Anyone  with known risk factors for hepatitis C.  Sexually transmitted infections (STIs)  You should be screened each year for STIs, including gonorrhea and chlamydia, if:  You are sexually active and are younger than 64 years of age.  You are older than 64 years of age and your health care provider tells you that you are at risk for this type of infection.  Your sexual activity has changed since you were last screened, and you are at increased risk for chlamydia or gonorrhea. Ask your health care provider if you are at risk.  Ask your health care provider about whether you are at high risk for HIV. Your health care provider  may recommend a prescription medicine to help prevent HIV infection. If you choose to take medicine to prevent HIV, you should first get tested for HIV. You should then be tested every 3 months for as long as you are taking the medicine.  Follow these instructions at home:  Alcohol use  Do not drink alcohol if your health care provider tells you not to drink.  If you drink alcohol:  Limit how much you have to 0-2 drinks a day.  Know how much alcohol is in your drink. In the [place] one drink equals one 12 oz bottle of beer (355 mL), one 5 oz glass of wine (148 mL), or one 1 oz glass of hard liquor (44 mL).  Lifestyle  Do not use any products that contain nicotine or tobacco. These products include cigarettes, chewing tobacco, and vaping devices, such as e-cigarettes. If you need help quitting, ask your health care provider.  Do not use street drugs.  Do not share needles.  Ask your health care provider for help if you need support or information about quitting drugs.  General instructions  Schedule regular health, dental, and eye exams.  Stay current with your vaccines.  Tell your health care provider if:  You often feel depressed.  You have ever been abused or do not feel safe at home.  Summary  Adopting a healthy lifestyle and getting preventive care are important in promoting health and wellness.  Follow your health care provider's instructions about healthy diet, exercising, and getting tested or screened for diseases.  Follow your health care provider's instructions on monitoring your cholesterol and blood pressure.  This information is not intended to replace advice given to you by your health care provider. Make sure you discuss any questions you have with your health care provider.  Document Revised: 07/19/2020 Document Reviewed: 07/19/2020  Elsevier Patient Education  2024 [employer].

## 2023-12-12 ENCOUNTER — Ambulatory Visit: Payer: Self-pay | Admitting: Internal Medicine

## 2023-12-12 LAB — COMPREHENSIVE METABOLIC PANEL WITH GFR
AG Ratio: 1.7 (calc) (ref 1.0–2.5)
ALT: 21 U/L (ref 9–46)
AST: 16 U/L (ref 10–35)
Albumin: 4.4 g/dL (ref 3.6–5.1)
Alkaline phosphatase (APISO): 44 U/L (ref 35–144)
BUN: 19 mg/dL (ref 7–25)
CO2: 27 mmol/L (ref 20–32)
Calcium: 9.3 mg/dL (ref 8.6–10.3)
Chloride: 105 mmol/L (ref 98–110)
Creat: 1.05 mg/dL (ref 0.70–1.35)
Globulin: 2.6 g/dL (ref 1.9–3.7)
Glucose, Bld: 92 mg/dL (ref 65–99)
Potassium: 4.4 mmol/L (ref 3.5–5.3)
Sodium: 141 mmol/L (ref 135–146)
Total Bilirubin: 0.9 mg/dL (ref 0.2–1.2)
Total Protein: 7 g/dL (ref 6.1–8.1)
eGFR: 79 mL/min/1.73m2 (ref >=60)

## 2023-12-12 LAB — CBC
HCT: 46.8 % (ref 38.5–50.0)
Hemoglobin: 15.3 g/dL (ref 13.2–17.1)
MCH: 28.2 pg (ref 27.0–33.0)
MCHC: 32.7 g/dL (ref 32.0–36.0)
MCV: 86.2 fL (ref 80.0–100.0)
MPV: 10.5 fL (ref 7.5–12.5)
Platelets: 216 Thousand/uL (ref 140–400)
RBC: 5.43 Million/uL (ref 4.20–5.80)
RDW: 13.6 % (ref 11.0–15.0)
WBC: 7.3 Thousand/uL (ref 3.8–10.8)

## 2023-12-12 LAB — LIPID PANEL
Cholesterol: 185 mg/dL (ref <200)
HDL: 47 mg/dL (ref >=40)
LDL Cholesterol (Calc): 112 mg/dL — ABNORMAL HIGH
Non-HDL Cholesterol (Calc): 138 mg/dL — ABNORMAL HIGH (ref <130)
Total CHOL/HDL Ratio: 3.9 (calc) (ref <5.0)
Triglycerides: 147 mg/dL (ref <150)

## 2023-12-12 LAB — HEMOGLOBIN A1C
Hgb A1c MFr Bld: 5.6 % (ref <5.7)
Mean Plasma Glucose: 114 mg/dL
eAG (mmol/L): 6.3 mmol/L

## 2023-12-12 LAB — PSA: PSA: 1.03 ng/mL (ref <=4.00)

## 2023-12-12 MED ORDER — ATORVASTATIN CALCIUM 40 MG PO TABS: 40.0000 mg | ORAL_TABLET | Freq: Every day | ORAL | 3 refills | Status: AC

## 2023-12-13 ENCOUNTER — Ambulatory Visit
Admit: 2023-12-13 | Discharge: 2023-12-13 | Payer: PRIVATE HEALTH INSURANCE | Attending: Student in an Organized Health Care Education/Training Program

## 2023-12-13 DIAGNOSIS — R399 Unspecified symptoms and signs involving the genitourinary system: Principal | ICD-10-CM

## 2023-12-13 LAB — POCT URINALYSIS DIPSTICK
Bilirubin, UA, POC: NEGATIVE
Blood, UA, POC: NEGATIVE
Glucose, UA, POC: NEGATIVE
Ketones, UA, POC: NEGATIVE
Leukocytes, UA, POC: NEGATIVE
Nitrite, UA, POC: NEGATIVE
Protein, UA, POC: NEGATIVE
Spec Gravity, UA, POC: 1.025 (ref 1.001–1.035)
Urobilinogen, UA, POC: NORMAL
pH, UA, POC: 6 (ref 5.0–8.0)

## 2023-12-13 NOTE — Progress Notes (Signed)
 [facility]  [facility]  [address]  [place] [address]  Dept: [phone]  Dept Fax: [phone]    UROLOGY CLINIC NOTE    Chief complaint:  Bladder stones  Lower urinary tract symptoms (LUTS)  Hematospermia    History of present illness:  This is a 63 y.o. male with a medical history including but not limited to cancer, ABMD of both eyes, cataract (Jan 2023), disease of thyroid gland, HSV epithelial keratitis, hypothyroidism, irregular astigmatism of both eyes, keratitis, left eye pseudophakia, visual impairment, kidney stones, LUTS, ED, and bladder stones.      1. Bladder stones: Since 2021. Previously passed small bladder stones that were uric acid in nature in 10/2020 (previously followed by Dr. Zachary CHARLENA [facility]).    Of note, the patient was previously followed by [facility] in 10/2020 and [facility] in 04/2021.    Cystoscopy on 02/21/2021 with Dr. Kieth Dimes [facility]) revealed multiple tiny calcifications in the bladder (ie. Milk of magnesia) creating a snow globe appearance. Bladder outlet obstruction.    Per patient, every couple of months he passes several bladder stones. Stone analysis as below.     Stone Analysis:   09/17/2020: 80% Uric acid dihydrate  20% Calcium oxalate dihydrate     LithoLink:   08/02/2022:       CT Scan ABD/Pelvis without contrast on 07/28/2022 revealed no hydronephrosis. No radiopaque renal stones. Thin hyperdensity along the anterior bladder wall, representing layering tiny bladder stones versus wall calcification. No significant bladder wall thickening. Prostatomegaly. 4 mm right lower lobe pulmonary nodule. Consider follow-up chest CT in 12 months.    No further bladder stones since last visit in clinic 11/2022.      2. Lower urinary tract symptoms (LUTS): Since 2020. His symptoms include a slow urinary stream, incomplete bladder emptying, urinary frequency q 2 hours during the day, urinary urgency and  nocturia x 1-2. Currently managed with Alfuzosin  10 mg since 04/2021 with moderate effect.    PVR 07/03/22: 267ccs  IPSS: 24 / 4    Cystoscopy 08/2022 with significant intravesical growth and 3+ trabeculation, unable to provide flow. Outlet obstruction due to large 80gm intravesical prostate and deteriorating bladder function seen on cystoscopy. No bladder stone visualized.     UCx 11/2022, unremarkable.     S/p HoLEP 11/28/2022. Tissue pathology showed benign prostatic hyperplasia. Benign urothelium.  32gm tissue removed.    VT passed on December 10, 2022. Pt called 12/12/2022 with concern of blood clots post procedure. PVR in GU clinic on 12/14/2022 3 mL. Gross hematuria and blood clots resolved shortly after appointment in 12/2022.  No post-op incontinence whatsover.    In clinic 02/2023, states he is very happy with the procedure and doing well. Feels he is able to fully empty his bladder with a strong stream. Daytime urinary frequency q2-3 hours, and nocturia x 1. He has not had any urinary incontinence. 1 x episode of retrograde ejaculation.    12/13/23: reports that has had nocturia increase x2-3 over the last few months i/s/o increased stressors at work.    Notices wakes up from stress, reads to get back to sleep and also uses the bathroom.  No change in strong stream or daytime symptoms.      IPSS: 3/0  PVR: 110 mL    PVR 12/13/23: 3ccs.    3. Hematospermia:Since 03/2021. Patient reports multiple episodes of blood in his semen over the past few months. It  has yet to resolve. No recent imaging. His last cystoscopy was 02/2021 (details as above).    Denies further episodes of hematospermia since he was last seen in clinic in 11/2022.    The patient has no urgent complaints.     Denies gross hematuria, urethral discharge, pyuria, dysuria, fever, chills, nausea vomiting, abdominal pain, flank pain and weight loss.    PSA:   04/01/2016: 1.0 - [facility].  09/14/2020: 1.54 - [facility] [employer].  10/27/2021: 1.62 - [facility]  [employer].  07/28/2022: 1.6.       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       Past Surgical History:   Procedure Laterality Date    CATARACT EXTRACTION Right 02/21/2022    COLONOSCOPY      EYE SURGERY  June 30, 2021    cataract    INGUINAL HERNIA REPAIR  February 2023    OTHER SURGICAL HISTORY      colorectal surgery due to precancer cells    THYROIDECTOMY       Social History     Tobacco Use    Smoking status: Never     Passive exposure: Never    Smokeless tobacco: Never   Vaping Use    Vaping status: Never Used   Substance Use Topics    Alcohol use: Yes     Alcohol/week: 1.0 standard drink of alcohol     Types: 1 Glasses of wine per week     Comment: 1 glass of wine on occasion    Drug use: Never       Home Medications:    Current Outpatient Medications:     ketorolac  (Acular ) 0.5 % ophthalmic solution, Administer 1 drop into the right eye four times daily. Start 1 day before surgery, Disp: 10 mL, Rfl: 3    levothyroxine (Synthroid, Levoxyl) 137 mcg tablet, Take 137 mcg by mouth once daily., Disp: , Rfl:     ofloxacin  (Ocuflox ) 0.3 % ophthalmic solution, Administer 1 drop into the right eye four times daily. Start 1 day before surgery (Patient not taking: Reported on 04/05/2022), Disp: 10 mL, Rfl: 3    prednisoLONE  acetate (Pred-Forte) 1 % ophthalmic suspension, Administer 1 drop  into the right eye four times daily. Start 1 day before surgery (Patient not taking: Reported on 04/05/2022), Disp: 10 mL, Rfl: 3    Prolensa  0.07 % drops, ADMINISTER 1 DROP INTO AFFECTED EYE(S) ONCE DAILY. AFTER SURGERY- NOT COVERED (Patient not taking: Reported on 04/05/2022), Disp: 9 mL, Rfl: 3    sildenafil (Viagra) 100 mg tablet, Take 50 mg by mouth if needed., Disp: , Rfl:     Allergies:  No Known Allergies  ROS:   Constitutional: No fevers, chills, weight loss or general weakness.   HENT: No headache or dizziness, otherwise negative.  Eyes: No loss of vision or double vision.   Lungs: No shortness of breath. No new, frequent cough.   Cardiovascular: No chest pain or palpitations.   Gastrointestinal: No abdominal pain, nausea, or vomiting  Genitourinary: See HPI.  Musculoskeletal: No joint/muscle pain or swelling.  Skin: Negative for rash  Endocrine: Negative   Hematologic: No easy bruising or bleeding  Neurological: No seizures, light headedness, or numbness  Psychiatry: No mood or behavioral changes.    Objective:    There were no vitals filed for this visit.          Physical exam:  General Appearance: Well-developed, well nourished, alert and cooperative, NAD  HEENT: Normo-cephalic, atraumatic, EOMI, anicteric, vision grossly intact, no nasal discharge.   Skin: Normal color, texture, turgor, with no lesions, eruptions, rashes, bruises or petechiae.  Neck: Supple, no masses. Normal ROM. Trachea is in midline.   Chest: Normal AP diameter. No rib tenderness.  Lungs: Normal respiratory effort, symmetric excursion with no accessory muscle use.  Back: Vertebral column aligned, no masses or tenderness. No CVAT b/l.   Cardiovascular: regular rate, no varicosities  Abdomen: Soft, benign, non-tender, non-distended, no palpable masses.  Bladder non palpable  Extremities: No clubbing, cyanosis or pitting edema.   Musculo-Skeletal: No muscle wasting, joint swelling or tenderness.  Lymphatic: No cervical or inguinal  adenopathy.   Neurologic: Oriented x3 with no focal deficits, normal gait.    GU Male:   Phallus: Normally developed, no lesions.   Glans: No lesions or inflammation.  Meatus: Orthotopic, no discharge.  Scrotum: No mass or tenderness. No hernias, hydroceles or varicoceles  Epididymis: No masses, tenderness or induration.  Testes: Normal size and consistency. No masses, asymmetry, tenderness or atrophy.  Rectal: Normal sphincter tone. No perineal, anal or rectal lesions.  Prostate: 2+ enlarged, smooth, non-tender and symmetrical, no palpable nodules  Bladder: non-tender and not palpable.    Pathology:  Tissue Pathology:  11/28/2022:   Component    Final Diagnosis   A. Prostate; Prostatic Chips:  Benign prostatic hyperplasia.   Benign urothelium.     Electronically signed by Florencio Cummins, MD on 12/04/2022 at 1709   Clinical Information    Benign prostatic hyperplasia with urinary obstruction       Results: I have reviewed and interpreted the labs below:      BMP:   No results found for: NA, K, CL, CO2, BUN, CREATININE, GLU      No results found for: PTH, CALCIUM      Last Urinalysis:   Protein, UA, POC (no units)   Date Value   02/28/2023 Negative      No results found for: LEO HITCH, SQUAUR, MUCOUR   No results found for: BACTUR   Blood, UA, POC (no units)   Date Value   02/28/2023 Negative     BUN,Cr,GFR:  01/2017: 18, 0.98, 85  04/2018: 19, 0.90, 94  02/2023: 18, 1.00, 85    Stone Analysis:   11/06/2016: 100% CaOx Monohydrate  09/17/2020: 80% Uric acid dihydrate  20% Calcium oxalate dihydrate         PSA:   04/01/2016: 1.0 - [facility].  09/14/2020: 1.54 - [facility].  10/27/2021: 1.62 - [facility] [employer].  07/28/2022: 1.60  02/2023: 0.36     LithoLink:   08/02/2022:  Imaging (Reviewed by Dr. Regena):  Cystoscopy:  02/21/2021 (Dr. Kieth Dimes - [facility]): Multiple tiny calcifications in the bladder (ie. Milk of magnesia) creating a snow globe appearance.  Bladder outlet obstruction.  08/2022:  with significant intravesical growth and 3+ trabeculation, unable to provide flow. Outlet obstruction due to large 80gm intravesical prostate and deteriorating bladder function seen on cystoscopy. No bladder stones visualized.     CT Scan ABD/Pelvis without contrast:  07/28/2022: No hydronephrosis. No radiopaque renal stones. Thin hyperdensity along the anterior bladder wall, representing layering tiny bladder stones versus wall calcification. No significant bladder wall thickening. Prostatomegaly. 4 mm right lower lobe pulmonary nodule. Consider follow-up chest CT in 12 months.      Assessment/Plan:  This is a 63 y.o. male with the following urologic problems:    1. Bladder and Kidney stones: Patient passes several bladder stones every few months x 2-3 years. Likely due to bladder outlet obstruction. Patient also with a history of renal colic in 2018 requiring URS/HLL. CTAP on 07/2022 with no renal stones or hydro, layering tiny bladder stones vs calcifications along anterior bladder wall. Litholink from 07/2022 with suboptimal urine volume, hyperuricosuria, and mild acid supersaturation.     Plan:  - Decrease animal proteins, add more citrate to diet.    2. Lower urinary tract symptoms (LUTS): Symptoms include slow urinary stream, incomplete bladder emptying, urinary frequency q 2 hours during the day, urinary urgency and nocturia x 1-2. Currently managed with Alfuzosin  10 mg since 04/2021 with persistent moderate-severe bothersome LUTS. Cystoscopy today with significant intravesical growth and 3+ trabeculation, unable to provide flow. No bladder stones visualized. Discussed outlet obstruction due to large 80gm intravesical prostate and deteriorating bladder function seen on cystoscopy. Patient agreeable.    Now s/p HoLEP in 11/2022 at [facility]. 16gm benign tissue removed. He is very happy with the outcome of his procedure. PVR today 50mL, IPSS 3/0.     Plan:  -Resolved    -F/U  PRN recurrent LUTS    3. Hematospermia: Multiple episodes over the past 2-3 months.  Discussed likely benign etiology.    Plan:  -Observation    4. PSA screening; PSA 1.7 07/2022.  Advised continued annual monitoring and RTC for PSA >3.  Suspect will be < 1 s/p HoLEP.  Baseline post-HoLEP PSA 0.36    - Continue annual-semi-annual PSA screening with PMD    I, Lonni Regena, MD, spent a total of 30 minutes (this includes time spent in preparation of the note) with the patient Brian Tucker reviewing the medical history, labs and imaging for management, treatment, counseling and coordination of care.    Lonni Regena, M.D.  Urology

## 2023-12-13 NOTE — Progress Notes (Signed)
 PVR: patient's bladder was scanned with a Verathon U/S scanner at least 3 times with volume 3 mL.

## 2023-12-13 NOTE — Addendum Note (Signed)
 Addended by: Kiptyn Rafuse E on: 12/13/2023 03:49 PM     Modules accepted: Orders

## 2024-01-03 ENCOUNTER — Other Ambulatory Visit: Payer: Self-pay | Admitting: Internal Medicine

## 2024-01-03 DIAGNOSIS — Z0001 Encounter for general adult medical examination with abnormal findings: Secondary | ICD-10-CM

## 2024-01-04 NOTE — Telephone Encounter (Signed)
 Discontinued on 12/12/23.  Requested Prescriptions  Pending Prescriptions Disp Refills   atorvastatin  (LIPITOR) 20 MG tablet [Pharmacy Med Name: ATORVASTATIN  20MG  TABLETS] 90 tablet 3    Sig: TAKE 1 TABLET(20 MG) BY MOUTH DAILY     Cardiovascular:  Antilipid - Statins Failed - 01/04/2024  1:38 PM      Failed - Lipid Panel in normal range within the last 12 months    Cholesterol  Date Value Ref Range Status  12/11/2023 185 <200 mg/dL Final   LDL Cholesterol (Calc)  Date Value Ref Range Status  12/11/2023 112 (H) mg/dL (calc) Final    Comment:    Reference range: <100 . Desirable range <100 mg/dL for primary prevention;   <70 mg/dL for patients with CHD or diabetic patients  with > or = 2 CHD risk factors. SABRA LDL-C is now calculated using the Martin-Hopkins  calculation, which is a validated novel method providing  better accuracy than the Friedewald equation in the  estimation of LDL-C.  Gladis APPLETHWAITE et al. SANDREA. 7986;689(80): 2061-2068  (http://education.[employer].com/faq/FAQ164)    Direct LDL  Date Value Ref Range Status  05/25/2020 159.0 mg/dL Final    Comment:    Optimal:  <100 mg/dLNear or Above Optimal:  100-129 mg/dLBorderline High:  130-159 mg/dLHigh:  160-189 mg/dLVery High:  >190 mg/dL   HDL  Date Value Ref Range Status  12/11/2023 47 > OR = 40 mg/dL Final   Triglycerides  Date Value Ref Range Status  12/11/2023 147 <150 mg/dL Final         Passed - Patient is not pregnant      Passed - Valid encounter within last 12 months    Recent Outpatient Visits           3 weeks ago Encounter for general adult medical examination with abnormal findings   [facility], Angeline ORN, NP

## 2024-01-08 ENCOUNTER — Ambulatory Visit: Admit: 2024-01-08 | Discharge: 2024-01-08 | Payer: PRIVATE HEALTH INSURANCE | Attending: Specialist

## 2024-01-08 DIAGNOSIS — H169 Unspecified keratitis: Secondary | ICD-10-CM

## 2024-01-08 NOTE — Progress Notes (Signed)
 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

## 2024-02-18 ENCOUNTER — Ambulatory Visit: Admit: 2024-02-18 | Discharge: 2024-02-18 | Payer: PRIVATE HEALTH INSURANCE

## 2024-02-18 NOTE — Assessment & Plan Note (Addendum)
 None since lithotripsy L ureter 11/06/16.    Problem List Items Addressed This Visit           ICD-10-CM    Kidney stones N20.0    Overview   Lithotripsy L ureter 11/07/26         Current Assessment & Plan   None since lithotripsy L ureter 11/06/16         Encounter for general adult medical examination with abnormal findings - Primary Z00.01    Overview   02/18/24         Relevant Orders    CBC and differential    TSH    T4, free    Comprehensive metabolic panel    Lipid panel    Hemoglobin A1c    Magnesium    PSA Total, Screen    Hypothyroidism E03.9    Overview   2/2 to Thyroidectomy. Thyroid CA in past         Current Assessment & Plan   Euthyroid on Levoxyl 137 mcg. Daily.         Benign prostatic hyperplasia with urinary obstruction N40.1, N13.8    Overview   hoLEP 11/28/22  52649 PR laser enucleation prostate with Morcellation         Current Assessment & Plan   Ssymptomatic

## 2024-02-18 NOTE — Assessment & Plan Note (Signed)
 Ssymptomatic

## 2024-02-18 NOTE — Assessment & Plan Note (Signed)
 Euthyroid on Levoxyl 137 mcg. Daily.

## 2024-02-18 NOTE — Progress Notes (Signed)
 [facility]  [address]  [place] [address]  Dept: [phone]  Dept Fax: [phone]     Patient ID: Brian Tucker is a 63 y.o. male who presents for Annual Exam.    Subjective  Here as new patient both because his PCP has moved and because he is due for an annual physical      Problem List[1]  Current Outpatient Medications   Medication Instructions    levothyroxine (SYNTHROID, LEVOXYL) 137 mcg, Daily       Objective  Visit Vitals  BP 110/70   Pulse 82   Temp 37.1 C (98.7 F)   Resp 16   Wt 74.4 kg   SpO2 96%   BMI 22.24 kg/m   BSA 1.94 m     Physical Exam  Constitutional:       Appearance: Normal appearance.   HENT:      Right Ear: Tympanic membrane and external ear normal.      Left Ear: Tympanic membrane and external ear normal.      Mouth/Throat:      Mouth: Mucous membranes are dry.      Pharynx: No posterior oropharyngeal erythema.   Cardiovascular:      Rate and Rhythm: Regular rhythm.      Pulses: Normal pulses.      Heart sounds: Normal heart sounds.   Pulmonary:      Breath sounds: Normal breath sounds.   Abdominal:      General: Bowel sounds are normal.   Musculoskeletal:         General: No tenderness. Normal range of motion.      Cervical back: Normal range of motion and neck supple.      Left lower leg: No edema.   Skin:     General: Skin is warm and dry.   Neurological:      General: No focal deficit present.      Mental Status: He is alert.   Psychiatric:         Mood and Affect: Mood normal.         Assessment / Plan  [name] was seen today for annual exam.  Encounter for general adult medical examination with abnormal findings  -     CBC and differential; Future  -     TSH; Future  -     T4, free; Future  -     Comprehensive metabolic panel; Future  -     Lipid panel; Future  -     Hemoglobin A1c; Future  -     Magnesium; Future  -     PSA Total, Screen; Future  Postablative hypothyroidism  Assessment & Plan:  Euthyroid on Levoxyl 137 mcg. Daily.  Benign prostatic  hyperplasia with urinary obstruction  Assessment & Plan:  Ssymptomatic  Kidney stones  Assessment & Plan:  None since lithotripsy L ureter 11/06/16.    Problem List Items Addressed This Visit           ICD-10-CM    Kidney stones N20.0    Overview   Lithotripsy L ureter 11/07/26         Current Assessment & Plan   None since lithotripsy L ureter 11/06/16         Encounter for general adult medical examination with abnormal findings - Primary Z00.01    Overview   02/18/24         Relevant Orders    CBC and differential  TSH    T4, free    Comprehensive metabolic panel    Lipid panel    Hemoglobin A1c    Magnesium    PSA Total, Screen    Hypothyroidism E03.9    Overview   2/2 to Thyroidectomy. Thyroid CA in past         Current Assessment & Plan   Euthyroid on Levoxyl 137 mcg. Daily.         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

## 2024-02-21 LAB — COMPREHENSIVE METABOLIC PANEL
ALT: 17 IU/L (ref 0–44)
AST: 21 IU/L (ref 0–40)
Albumin: 4.5 g/dL (ref 3.9–4.9)
Alk Phosphatase: 104 IU/L (ref 47–123)
Anion Gap: 9 mmol/L — ABNORMAL LOW (ref 10.0–18.0)
BUN/Creat Ratio: 23 (ref 10–24)
BUN: 23 mg/dL (ref 8–27)
Bili Total: 1.9 mg/dL — ABNORMAL HIGH (ref 0.0–1.2)
Calcium: 9.2 mg/dL (ref 8.6–10.2)
Carbon Dioxide: 27 mmol/L (ref 20–29)
Chloride: 106 mmol/L (ref 96–106)
Creat: 0.98 mg/dL (ref 0.76–1.27)
Globulin Total: 2.2 g/dL (ref 1.5–4.5)
Glucose: 101 mg/dL — ABNORMAL HIGH (ref 70–99)
Potassium: 4.5 mmol/L (ref 3.5–5.2)
Protein Total: 6.7 g/dL (ref 6.0–8.5)
Sodium: 142 mmol/L (ref 134–144)
eGFR: 86 mL/min/1.73 (ref >59)

## 2024-02-21 LAB — CBC W/DIFF
Baso Abs: 0.1 x10E3/uL (ref 0.0–0.2)
Basos: 1 %
Eos Abs: 0.1 x10E3/uL (ref 0.0–0.4)
Eos: 1 %
Hct: 45.2 % (ref 37.5–51.0)
Hgb: 15.1 g/dL (ref 13.0–17.7)
Immature Grans Abs: 0 x10E3/uL (ref 0.0–0.1)
Immature Granulocytes: 0 %
Lymphs Abs: 2.9 x10E3/uL (ref 0.7–3.1)
Lymphs: 44 %
MCH: 30.5 pg (ref 26.6–33.0)
MCHC: 33.4 g/dL (ref 31.5–35.7)
MCV: 91 fL (ref 79–97)
Monocytes: 8 %
MonocytesAbs: 0.5 x10E3/uL (ref 0.1–0.9)
Neutrophils Abs: 3 x10E3/uL (ref 1.4–7.0)
Neutrophils: 46 %
Platelets: 254 x10E3/uL (ref 150–450)
RBC: 4.95 x10E6/uL (ref 4.14–5.80)
RDW: 12.4 % (ref 11.6–15.4)
WBC: 6.5 x10E3/uL (ref 3.4–10.8)

## 2024-02-21 LAB — TSH: TSH: 0.288 u[IU]/mL — ABNORMAL LOW (ref 0.450–4.500)

## 2024-02-21 LAB — LIPID PANEL
Cholesterol: 196 mg/dL (ref 100–199)
HDL Cholesterol: 59 mg/dL (ref >39)
LDLc Calc (NIH): 128 mg/dL — ABNORMAL HIGH (ref 0–99)
Non-HDL Chol: 137 mg/dL — ABNORMAL HIGH (ref 0–129)
Triglycerides: 48 mg/dL (ref 0–149)
VLDLc Calc: 9 mg/dL (ref 5–40)

## 2024-02-21 LAB — HEMOGLOBIN A1C: HgbA1C: 5.7 % — ABNORMAL HIGH (ref 4.8–5.6)

## 2024-02-21 LAB — T4, FREE: T4Free(Direct): 1.68 ng/dL (ref 0.82–1.77)

## 2024-02-21 LAB — PSA TOTAL, SCREEN: PSA TOTAL: 0.4 ng/mL (ref 0.0–4.0)

## 2024-02-21 LAB — MAGNESIUM: Magnesium: 2.1 mg/dL (ref 1.6–2.3)

## 2024-12-16 ENCOUNTER — Encounter: Admitting: Internal Medicine
